# Patient Record
Sex: Female | Born: 1970
Health system: Southern US, Community
[De-identification: ages and names within clinical notes are randomized; demographics above are authoritative.]

## PROBLEM LIST (undated history)

## (undated) ENCOUNTER — Emergency Department (HOSPITAL_COMMUNITY): Admission: EM | Disposition: A | Discharge status: Home / Self Care | Payer: 59

## (undated) DIAGNOSIS — Z87898 Personal history of other specified conditions: Secondary | ICD-10-CM

## (undated) DIAGNOSIS — R102 Pelvic and perineal pain unspecified side: Secondary | ICD-10-CM

## (undated) DIAGNOSIS — Z87442 Personal history of urinary calculi: Secondary | ICD-10-CM

## (undated) DIAGNOSIS — N281 Cyst of kidney, acquired: Secondary | ICD-10-CM

## (undated) DIAGNOSIS — G8929 Other chronic pain: Secondary | ICD-10-CM

## (undated) DIAGNOSIS — T7840XA Allergy, unspecified, initial encounter: Secondary | ICD-10-CM

## (undated) DIAGNOSIS — D649 Anemia, unspecified: Secondary | ICD-10-CM

## (undated) DIAGNOSIS — K219 Gastro-esophageal reflux disease without esophagitis: Secondary | ICD-10-CM

## (undated) DIAGNOSIS — H04129 Dry eye syndrome of unspecified lacrimal gland: Secondary | ICD-10-CM

## (undated) HISTORY — DX: Personal history of urinary calculi: Z87.442

## (undated) HISTORY — DX: Gastro-esophageal reflux disease without esophagitis: K21.9

## (undated) HISTORY — DX: Personal history of other specified conditions: Z87.898

## (undated) HISTORY — PX: UPPER GASTROINTESTINAL ENDOSCOPY: SHX188

## (undated) HISTORY — DX: Allergy, unspecified, initial encounter: T78.40XA

## (undated) HISTORY — PX: TONSILLECTOMY: SHX5217

## (undated) HISTORY — DX: Pelvic and perineal pain unspecified side: R10.20

## (undated) HISTORY — DX: Anemia, unspecified: D64.9

## (undated) HISTORY — DX: Pelvic and perineal pain: R10.2

## (undated) HISTORY — DX: Dry eye syndrome of unspecified lacrimal gland: H04.129

## (undated) HISTORY — PX: APPENDECTOMY: SHX54

## (undated) HISTORY — PX: VAGINAL HYSTERECTOMY: SHX2639

## (undated) HISTORY — DX: Other chronic pain: G89.29

---

## 1998-10-23 ENCOUNTER — Other Ambulatory Visit: Admission: RE | Admit: 1998-10-23 | Discharge: 1998-10-23 | Payer: Self-pay | Admitting: Orthopedic Surgery

## 2000-02-17 ENCOUNTER — Encounter
Admission: RE | Admit: 2000-02-17 | Discharge: 2000-02-17 | Payer: Self-pay | Admitting: Physical Medicine & Rehabilitation

## 2000-02-17 ENCOUNTER — Encounter: Payer: Self-pay | Admitting: Physical Medicine & Rehabilitation

## 2000-02-26 ENCOUNTER — Encounter
Admission: RE | Admit: 2000-02-26 | Discharge: 2000-02-26 | Payer: Self-pay | Admitting: Physical Medicine & Rehabilitation

## 2000-02-26 ENCOUNTER — Encounter: Payer: Self-pay | Admitting: Physical Medicine & Rehabilitation

## 2000-02-27 ENCOUNTER — Other Ambulatory Visit: Admission: RE | Admit: 2000-02-27 | Discharge: 2000-02-27 | Payer: Self-pay | Admitting: Obstetrics & Gynecology

## 2000-03-19 ENCOUNTER — Ambulatory Visit (HOSPITAL_COMMUNITY): Admission: RE | Admit: 2000-03-19 | Discharge: 2000-03-19 | Payer: Self-pay | Admitting: Obstetrics & Gynecology

## 2001-05-23 ENCOUNTER — Other Ambulatory Visit: Admission: RE | Admit: 2001-05-23 | Discharge: 2001-05-23 | Payer: Self-pay | Admitting: Family Medicine

## 2001-12-28 HISTORY — PX: COLONOSCOPY: SHX174

## 2002-02-06 ENCOUNTER — Emergency Department (HOSPITAL_COMMUNITY): Admission: EM | Admit: 2002-02-06 | Discharge: 2002-02-06 | Payer: Self-pay | Admitting: Emergency Medicine

## 2002-08-29 ENCOUNTER — Encounter (INDEPENDENT_AMBULATORY_CARE_PROVIDER_SITE_OTHER): Payer: Self-pay | Admitting: Specialist

## 2002-08-29 ENCOUNTER — Ambulatory Visit (HOSPITAL_COMMUNITY): Admission: RE | Admit: 2002-08-29 | Discharge: 2002-08-29 | Payer: Self-pay | Admitting: Gastroenterology

## 2002-09-07 ENCOUNTER — Encounter: Payer: Self-pay | Admitting: Gastroenterology

## 2002-09-07 ENCOUNTER — Encounter: Admission: RE | Admit: 2002-09-07 | Discharge: 2002-09-07 | Payer: Self-pay | Admitting: Gastroenterology

## 2002-11-15 ENCOUNTER — Ambulatory Visit (HOSPITAL_COMMUNITY): Admission: RE | Admit: 2002-11-15 | Discharge: 2002-11-15 | Payer: Self-pay | Admitting: General Surgery

## 2002-11-15 ENCOUNTER — Encounter (INDEPENDENT_AMBULATORY_CARE_PROVIDER_SITE_OTHER): Payer: Self-pay | Admitting: Specialist

## 2003-03-07 ENCOUNTER — Other Ambulatory Visit: Admission: RE | Admit: 2003-03-07 | Discharge: 2003-03-07 | Payer: Self-pay | Admitting: Obstetrics and Gynecology

## 2003-05-24 ENCOUNTER — Encounter (INDEPENDENT_AMBULATORY_CARE_PROVIDER_SITE_OTHER): Payer: Self-pay

## 2003-05-24 ENCOUNTER — Ambulatory Visit (HOSPITAL_COMMUNITY): Admission: RE | Admit: 2003-05-24 | Discharge: 2003-05-24 | Payer: Self-pay | Admitting: Obstetrics and Gynecology

## 2003-10-16 ENCOUNTER — Encounter: Payer: Self-pay | Admitting: Obstetrics and Gynecology

## 2003-10-16 ENCOUNTER — Inpatient Hospital Stay (HOSPITAL_COMMUNITY): Admission: AD | Admit: 2003-10-16 | Discharge: 2003-10-16 | Payer: Self-pay | Admitting: Obstetrics and Gynecology

## 2003-11-12 ENCOUNTER — Other Ambulatory Visit: Admission: RE | Admit: 2003-11-12 | Discharge: 2003-11-12 | Payer: Self-pay | Admitting: Obstetrics and Gynecology

## 2004-04-01 ENCOUNTER — Inpatient Hospital Stay (HOSPITAL_COMMUNITY): Admission: AD | Admit: 2004-04-01 | Discharge: 2004-04-02 | Payer: Self-pay | Admitting: Pediatrics

## 2004-05-18 ENCOUNTER — Inpatient Hospital Stay (HOSPITAL_COMMUNITY): Admission: AD | Admit: 2004-05-18 | Discharge: 2004-05-20 | Payer: Self-pay | Admitting: Obstetrics and Gynecology

## 2005-01-07 ENCOUNTER — Ambulatory Visit (HOSPITAL_COMMUNITY)
Admission: RE | Admit: 2005-01-07 | Discharge: 2005-01-07 | Payer: Self-pay | Admitting: Physical Medicine & Rehabilitation

## 2005-02-05 ENCOUNTER — Other Ambulatory Visit: Admission: RE | Admit: 2005-02-05 | Discharge: 2005-02-05 | Payer: Self-pay | Admitting: Obstetrics and Gynecology

## 2005-09-03 ENCOUNTER — Other Ambulatory Visit: Admission: RE | Admit: 2005-09-03 | Discharge: 2005-09-03 | Payer: Self-pay | Admitting: Obstetrics and Gynecology

## 2005-10-20 ENCOUNTER — Observation Stay (HOSPITAL_COMMUNITY): Admission: RE | Admit: 2005-10-20 | Discharge: 2005-10-21 | Payer: Self-pay | Admitting: Physical Therapy

## 2005-10-20 ENCOUNTER — Encounter (INDEPENDENT_AMBULATORY_CARE_PROVIDER_SITE_OTHER): Payer: Self-pay | Admitting: *Deleted

## 2009-03-19 LAB — CONVERTED CEMR LAB

## 2009-03-22 ENCOUNTER — Emergency Department (HOSPITAL_COMMUNITY): Admission: EM | Admit: 2009-03-22 | Discharge: 2009-03-22 | Payer: Self-pay | Admitting: Family Medicine

## 2009-03-26 ENCOUNTER — Ambulatory Visit: Payer: Self-pay | Admitting: Internal Medicine

## 2009-03-26 DIAGNOSIS — F17200 Nicotine dependence, unspecified, uncomplicated: Secondary | ICD-10-CM

## 2009-03-26 DIAGNOSIS — R599 Enlarged lymph nodes, unspecified: Secondary | ICD-10-CM | POA: Insufficient documentation

## 2009-03-26 DIAGNOSIS — Z87442 Personal history of urinary calculi: Secondary | ICD-10-CM | POA: Insufficient documentation

## 2009-03-26 DIAGNOSIS — R635 Abnormal weight gain: Secondary | ICD-10-CM

## 2009-03-26 DIAGNOSIS — R609 Edema, unspecified: Secondary | ICD-10-CM

## 2009-03-27 ENCOUNTER — Ambulatory Visit: Payer: Self-pay | Admitting: Internal Medicine

## 2009-03-27 LAB — CONVERTED CEMR LAB
ALT: 18 units/L (ref 0–35)
AST: 18 units/L (ref 0–37)
Albumin: 4 g/dL (ref 3.5–5.2)
BUN: 15 mg/dL (ref 6–23)
Basophils Absolute: 0 10*3/uL (ref 0.0–0.1)
Bilirubin Urine: NEGATIVE
CO2: 30 meq/L (ref 19–32)
Chloride: 106 meq/L (ref 96–112)
Cholesterol: 151 mg/dL (ref 0–200)
Eosinophils Absolute: 0.1 10*3/uL (ref 0.0–0.7)
Free T4: 0.8 ng/dL (ref 0.6–1.6)
Glucose, Bld: 86 mg/dL (ref 70–99)
Hemoglobin: 14.4 g/dL (ref 12.0–15.0)
Ketones, ur: NEGATIVE mg/dL
Leukocytes, UA: NEGATIVE
Lymphocytes Relative: 31.4 % (ref 12.0–46.0)
MCHC: 35 g/dL (ref 30.0–36.0)
MCV: 93.5 fL (ref 78.0–100.0)
Monocytes Absolute: 0.3 10*3/uL (ref 0.1–1.0)
Neutro Abs: 3 10*3/uL (ref 1.4–7.7)
Potassium: 4.5 meq/L (ref 3.5–5.1)
RDW: 11.8 % (ref 11.5–14.6)
Triglycerides: 62 mg/dL (ref 0.0–149.0)
Urobilinogen, UA: 0.2 (ref 0.0–1.0)

## 2009-05-16 ENCOUNTER — Emergency Department (HOSPITAL_COMMUNITY): Admission: EM | Admit: 2009-05-16 | Discharge: 2009-05-16 | Payer: Self-pay | Admitting: Family Medicine

## 2009-05-31 ENCOUNTER — Telehealth: Payer: Self-pay | Admitting: Internal Medicine

## 2009-05-31 ENCOUNTER — Ambulatory Visit: Payer: Self-pay | Admitting: Diagnostic Radiology

## 2009-05-31 ENCOUNTER — Ambulatory Visit: Payer: Self-pay | Admitting: Internal Medicine

## 2009-05-31 ENCOUNTER — Ambulatory Visit (HOSPITAL_BASED_OUTPATIENT_CLINIC_OR_DEPARTMENT_OTHER): Admission: RE | Admit: 2009-05-31 | Discharge: 2009-05-31 | Payer: Self-pay | Admitting: Internal Medicine

## 2009-05-31 DIAGNOSIS — R109 Unspecified abdominal pain: Secondary | ICD-10-CM

## 2009-09-26 ENCOUNTER — Ambulatory Visit (HOSPITAL_BASED_OUTPATIENT_CLINIC_OR_DEPARTMENT_OTHER): Admission: RE | Admit: 2009-09-26 | Discharge: 2009-09-26 | Payer: Self-pay | Admitting: Internal Medicine

## 2009-09-26 ENCOUNTER — Ambulatory Visit: Payer: Self-pay | Admitting: Internal Medicine

## 2009-09-26 ENCOUNTER — Ambulatory Visit: Payer: Self-pay | Admitting: Diagnostic Radiology

## 2009-09-26 ENCOUNTER — Telehealth: Payer: Self-pay | Admitting: Internal Medicine

## 2009-09-26 DIAGNOSIS — M533 Sacrococcygeal disorders, not elsewhere classified: Secondary | ICD-10-CM

## 2009-10-28 ENCOUNTER — Telehealth: Payer: Self-pay | Admitting: Internal Medicine

## 2009-11-04 ENCOUNTER — Ambulatory Visit: Payer: Self-pay | Admitting: Internal Medicine

## 2009-11-05 ENCOUNTER — Telehealth: Payer: Self-pay | Admitting: Internal Medicine

## 2009-11-11 ENCOUNTER — Encounter: Payer: Self-pay | Admitting: Internal Medicine

## 2010-07-21 ENCOUNTER — Ambulatory Visit: Payer: Self-pay | Admitting: Internal Medicine

## 2010-07-22 ENCOUNTER — Emergency Department (HOSPITAL_COMMUNITY): Admission: EM | Admit: 2010-07-22 | Discharge: 2010-07-23 | Payer: Self-pay | Admitting: Emergency Medicine

## 2011-01-27 NOTE — Assessment & Plan Note (Signed)
Summary: CONGESTION x10 DAYS COUGH/MHF   Vital Signs:  Patient profile:   40 year old female Height:      62 inches Weight:      143.75 pounds BMI:     26.39 O2 Sat:      99 % on Room air Temp:     98.0 degrees F oral Pulse rate:   69 / minute Pulse rhythm:   regular Resp:     18 per minute BP sitting:   90 / 60  (left arm) Cuff size:   large  Vitals Entered By: Glendell Docker CMA (July 21, 2010 2:05 PM)  O2 Flow:  Room air CC: Rm 3- Chest Congestion Is Patient Diabetic? No Pain Assessment Patient in pain? no        Primary Care Provider:  DThomos Lemons DO  CC:  Rm 3- Chest Congestion.  History of Present Illness: 40 y/o female  c/o nasal congestion, drainage clear to yellow, productive cough yellow in color x 5 days,  nausea &vomiting , no temp at present, taken rx cough syrup from [facility] .nasal congestion better but persistent cough  Preventive Screening-Counseling & Management  Alcohol-Tobacco     Smoking Status: quit  Allergies (verified): No Known Drug Allergies  Past History:  Past Medical History: Nephrolithiasis, hx of Hx of fainting spells during pregnancy  Hx of chronic pelvic pain - improved after hysterectomy         Past Surgical History: Appendectomy  Tonsillectomy Hysterectomy  3 vaginal births Colonoscopy 2003 - Dr. Loreta Ave - Normal)     Family History: Family hx of uterine cancer PGF - DM II, CAD, hyperlipidemia    breast cancer - no   Social History: Occupation: Engineer, water for [employer] Married- 6 years Children - teenagers    Smoking Status:  quit  Physical Exam  General:  alert, well-developed, and well-nourished.   Ears:  R ear normal and L ear normal.   Mouth:  pharynx pink and moist.  post nasal gtt Lungs:  normal respiratory effort, normal breath sounds, and no wheezes.   Heart:  normal rate, regular rhythm, and no gallop.     Impression & Recommendations:  Problem # 1:  BRONCHITIS-ACUTE  (ICD-466.0)  Her updated medication list for this problem includes:    Azithromycin 250 Mg Tabs (Azithromycin) .Marland Kitchen... 2 tabs on day one, then one by mouth once daily x 4 days  Take antibiotics and other medications as directed. Encouraged to push clear liquids, get enough rest, and take acetaminophen as needed. To be seen in 5-7 days if no improvement, sooner if worse.  Complete Medication List: 1)  Azithromycin 250 Mg Tabs (Azithromycin) .... 2 tabs on day one, then one by mouth once daily x 4 days 2)  Prednisone 20 Mg Tabs (Prednisone) .... One by mouth two times a day x 4 days, then 1/2 by mouth two times a day x 4 days, then 1/2 by mouth once daily x 4 days  Patient Instructions: 1)  Call our office if your symptoms do not  improve or gets worse. Prescriptions: PREDNISONE 20 MG TABS (PREDNISONE) one by mouth two times a day x 4 days, then 1/2 by mouth two times a day x 4 days, then 1/2 by mouth once daily x 4 days  #14 x 0   Entered and Authorized by:   D. Thomos Lemons DO   Signed by:   D. Thomos Lemons DO on 07/21/2010   Method  used:   Electronically to        [facility]* (retail)       [address]       [address] Shipping/mailing       [place], [place]  [address]       Ph: [phone]       Fax: [phone]   RxID:   [id number] AZITHROMYCIN 250 MG TABS (AZITHROMYCIN) 2 tabs on day one, then one by mouth once daily x 4 days  #6 x 0   Entered and Authorized by:   D. Thomos Lemons DO   Signed by:   D. Thomos Lemons DO on 07/21/2010   Method used:   Electronically to        [facility]* (retail)       [address]       [address] Shipping/mailing       [place], [place]  [address]       Ph: [phone]       Fax: [phone]   RxID:   [id number]     Current Allergies (reviewed today): No known allergies

## 2011-03-14 LAB — POCT I-STAT, CHEM 8
BUN: 4 mg/dL — ABNORMAL LOW (ref 6–23)
Calcium, Ion: 1.19 mmol/L (ref 1.12–1.32)
Creatinine, Ser: 0.8 mg/dL (ref 0.4–1.2)
Glucose, Bld: 112 mg/dL — ABNORMAL HIGH (ref 70–99)
Hemoglobin: 14.6 g/dL (ref 12.0–15.0)
Sodium: 141 mEq/L (ref 135–145)
TCO2: 28 mmol/L (ref 0–100)

## 2011-03-14 LAB — URINALYSIS, ROUTINE W REFLEX MICROSCOPIC
Bilirubin Urine: NEGATIVE
Hgb urine dipstick: NEGATIVE
Ketones, ur: NEGATIVE mg/dL
Protein, ur: NEGATIVE mg/dL
Urobilinogen, UA: 0.2 mg/dL (ref 0.0–1.0)

## 2011-05-15 NOTE — Op Note (Signed)
[name], [name]                          ACCOUNT NO.:  [id number]   MEDICAL RECORD NO.:  [id number]                   PATIENT TYPE:  AMB   LOCATION:  SDC                                  FACILITY:  [facility]   PHYSICIAN:  Dineen Kid. Rana Snare, M.D.                 DATE OF BIRTH:  08/01/1971   DATE OF PROCEDURE:  05/24/2003  DATE OF DISCHARGE:                                 OPERATIVE REPORT   PREOPERATIVE DIAGNOSES:  1. Dysmenorrhea.  2. Pelvic pain.  3. Dyspareunia.  4. Abnormal uterine bleeding.  5. Endometrial mass consistent with a polyp.   POSTOPERATIVE DIAGNOSES:  1. Dysmenorrhea.  2. Pelvic pain.  3. Dyspareunia.  4. Abnormal uterine bleeding.  5. Endometrial mass consistent with a polyp.   PROCEDURE:  Hysteroscopy with D&C with polypectomy and diagnostic  laparoscopy.   SURGEON:  Dineen Kid. Rana Snare, M.D.   ANESTHESIA:  General endotracheal anesthesia.   INDICATIONS FOR PROCEDURE:  The patient is a 40 year old G2, P2 with  abnormal uterine bleeding not responding to birth control pills.  She  underwent a sonohistogram which shows endometrial mass consistent with a  small polyp and a right ovarian cyst.  The patient has also been having  worsening pelvic pain over the last number of years, worse in the last  several months.  She was previously told she may have endometriosis.  She  has had pain with intercourse and pain with adherence, again no responsive  to conservative medical management.  She desire more definitive surgical  evaluation of this.  Risks and benefits were discussed.  Informed consent  was obtained.   FINDINGS:  Small endometrial polyp, posterior endometrium.  After D&C,  normal-appearing endometrial cavity, normal-appearing ostium.  Laparoscope  reveals a bulky appearing uterus, otherwise a normal cul-de-sac, ovaries,  tubes, bladder; there is a small staple in the cul-de-sac from previous  appendectomy which was removed and a normal-appearing gallbladder  and liver.   DESCRIPTION OF PROCEDURE:  After adequate analgesia, the patient was placed  in the dorsal lithotomy position.  She was sterilely prepped and draped.  Bladder was sterilely drained.  Graves speculum was placed.  Tenaculum was  placed in the anterior lip of the cervix.  The uterus was sounded to  approximately 9 cm, easily dilated to a #27 Pratt dilator.  Hysteroscope was  inserted and the above findings were noted.  Curettage was performed  retrieving small fragments of endometrial polyps.  This was performed until  the gritty surface was felt throughout the endometrial cavity followed by  hysteroscope which revealed a normal-appearing endometrial cavity and no  residual polyps at this time. At this point, the tenaculum was changed out  to a Hulka tenaculum and hysteroscope was removed as was the Graves  speculum.  The 1 cm skin incision was made.  A Veress needle was inserted.  The abdomen was insufflated, dullness  to percussion and an 11 mm trocar was  inserted and laparoscope was then inserted with the above findings noted.  A  5 mm trocar was inserted to the left of midline two fingerbreadths above the  pubic symphysis and a 5 mm trocar was inserted under direct visualization.  After careful examination of the abdomen, pelvis and cul-de-sac, small  staple from the previous appendectomy was easily removed.  The only abnormal  finding at this point was slightly  bulky uterus.  Trocars were removed and  noted to be hemostatic.  The infraumbilical skin incision was closed with 0  Vicryl in the fascia, the 5 mm site interrupted suture of 0 Vicryl was  placed in the fascia as well.  3-0 Vicryl Rapide was used as a subcuticular  stitch to close the incisions with good approximation and good hemostasis.  The incisions were then injected with 0.25% Marcaine.  The tenaculum was  removed from the cervix and noted to be hemostatic.  The patient was stable  on transfer to the recovery  room.  Sponge and instrument count was correct  x3.  The estimated blood loss was less than 10 mL with a notable Sorbitol  deficit.   DISPOSITION:  The patient is discharged home and will follow up in the  office in two to three weeks.  She is sent home with a routine instruction  sheet for laparoscopy and D&C and a prescription for Darvocet #30.                                               Dineen Kid Rana Snare, M.D.    DCL/MEDQ  D:  05/24/2003  T:  05/24/2003  Job:  [id number]

## 2011-05-15 NOTE — Discharge Summary (Signed)
NAMEKINDLE, STROHMEIER                ACCOUNT NO.:  [id number]   MEDICAL RECORD NO.:  [id number]          PATIENT TYPE:  OBV   LOCATION:  9312                          FACILITY:  [facility]   PHYSICIAN:  Dineen Kid. Rana Snare, M.D.    DATE OF BIRTH:  03-13-1971   DATE OF ADMISSION:  10/20/2005  DATE OF DISCHARGE:  10/21/2005                                 DISCHARGE SUMMARY   HISTORY OF PRESENT ILLNESS:  Ms. [name] is a 40 year old G3, P3, with  menometrorrhagia, dyspareunia, pelvic pain, did have right lower quadrant  pain with radiation through her back, worse when she is sitting for  prolonged periods of time.  She is unable to tolerate hormones due to  migraines.  Ultrasound and sonohysterogram shows a retroverted uterus, no  intracavitary masses.  She also continues to have dyspareunia which affects  her ability to have normal relations with her husband.  She also bleeds  eight to 10 days a month with her cycle, and her cycle is every 21 days.  She is desirous to have a surgical intervention and presents for  laparoscopically-assisted vaginal hysterectomy.   HOSPITAL COURSE:  The patient underwent LAVH.  Surgery was uncomplicated.  Her blood loss was 700 mL, however.  Her postoperative care was  unremarkable.  By postop day #1 she was tolerating a regular diet,  ambulating without difficulty, able to pass flatus.  Her hemoglobin  postoperatively was 9.1.  The incision was clean, dry, and intact.  She had  normoactive bowel sounds, and the patient was discharged home.   DISPOSITION:  The patient will be discharged home and will follow up in the  office in one to two weeks.  Told to return for any increased pain, fever or  bleeding.  She was sent home with a prescription for Darvocet #30 and  ibuprofen and iron.      Dineen Kid Rana Snare, M.D.  Electronically Signed     DCL/MEDQ  D:  10/21/2005  T:  10/21/2005  Job:  [id number]

## 2011-05-15 NOTE — Op Note (Signed)
[name], [name]                            ACCOUNT NO.:  [id number]   MEDICAL RECORD NO.:  [id number]                   PATIENT TYPE:  AMB   LOCATION:  DAY                                  FACILITY:  [facility]   PHYSICIAN:  Ollen Gross. Vernell Morgans, M.D.              DATE OF BIRTH:  January 03, 1971   DATE OF PROCEDURE:  11/15/2002  DATE OF DISCHARGE:                                 OPERATIVE REPORT   PREOPERATIVE DIAGNOSIS:  Chronic right lower quadrant pain.   POSTOPERATIVE DIAGNOSIS:  Chronic right lower quadrant pain.   PROCEDURE:  Diagnostic laparoscopy and laparoscopic appendectomy.   SURGEON:  Ollen Gross. Carolynne Edouard, M.D.   ANESTHESIA:  General endotracheal.   DESCRIPTION OF PROCEDURE:  After informed consent was obtained, the patient  was brought to the operating room and placed in a supine position on the  operating room table.  After adequate induction of general anesthesia, the  patient's legs were placed in Yellowfin skis, and then the patient's abdomen  and perineum was prepped with Betadine and draped in the usual sterile  manner.  The area below the umbilicus was infiltrated with 0.25% Marcaine,  and the small incisions were made with the 15 blade knife.  This incision  was carried down through the subcutaneous tissue bluntly using Kelly clamps  and Army-Navy retractors until the linea alba was identified.  The linea  alba was incised with the 15 blade knife, and each side was grasped with  Kocher clamps and elevated anteriorly.  The preperitoneal space was then  probed bluntly with a hemostat until the peritoneum was opened and access  was gained to the abdominal cavity.  A 0 Vicryl pursestring stitch was  placed in the fascia surrounding this opening.  Next, a Hasson cannula was  placed through this opening and anchored in place with the previously-placed  Vicryl pursestring stitch.  The abdomen was then insufflated with carbon  dioxide.  The patient was placed in a Trendelenburg  position.  The  laparoscope was placed through the Hasson cannula, and the abdomen was  inspected.  The upper abdomen appeared normal.  The gallbladder had a normal  appearance as well as the liver.  There were no obvious adhesions down in  the pelvis.  Next, a small area in the left lower quadrant was infiltrated  with 0.25% Marcaine.  A small stab incision was made with the 15 blade  knife, and a 5 mm port was placed bluntly through this incision into the  abdominal cavity under direct vision in the right lower quadrant,.  Also, an  area was infiltrated with 0.25% Marcaine.  A small incision was made with  the 15 blade knife, and then a 12 mm port was placed bluntly through this  incision into the abdominal cavity under direct vision.  The abdomen was  explored.  The uterus appeared normal.  The ovaries were  identified on both  sides.  The left ovary had some small cysts.  The right ovary was about  twice the size of the left ovary and appeared to have one large cyst in it.  These findings were discussed with her GYN doctor, Malva Limes, M.D., over  the phone, who elected to observe these areas and not do anything about them  intraoperatively.  The right colon did appear to be somewhat floppy and did  extend across the midline overlying the uterus, but there was no  inflammation nor any kind of acute changes to the right colon.  It appeared  to be normal.  The appendix was identified very easily and appeared to be a  normal appendix without any inflammatory changes.  Because of the location  of the appendix and because of her desire to have it removed, the  mesoappendix was taken down with a harmonic scalpel.  An endoscopic GIA  stapler was then placed across the base of the appendix, and the stapler was  fired.  The appendix was divided without difficulty, and the staple line  appeared intact and healthy.  The appendix was then placed within an  endoscopic bag, and the bag was sealed.   Prior to removing the appendix,  pictures were taken of the pelvis to supply to the patient and for her  chart.  The laparoscope was then moved to the 12 mm port, and a grasper was  placed through the Hasson cannula and used to grasp the endoscopic bag.  The  bag with the appendix was then removed with the Hasson cannula.  The fascial  defect was then closed with the previously-placed Vicryl pursestring stitch.  The rest of the ports were removed under direct vision and were hemostatic.  The gas was allowed to escape.  The fascia of the right lower quadrant  incision was also closed with a figure-of-eight 0 Vicryl stitch.  The skin  incisions were all closed with interrupted 4-0 Monocryl subcuticular  stitches.  Benzoin and Steri-Strips were applied.  The patient tolerated the  procedure well.  At the end of the case, all needle, sponge, and instrument  counts were correct.  The patient was awakened and taken to the recovery  room in stable condition.                                               Ollen Gross. Vernell Morgans, M.D.    PST/MEDQ  D:  11/16/2002  T:  11/16/2002  Job:  [id number]

## 2011-05-15 NOTE — Op Note (Signed)
[name] Rivera, [name] Rivera                ACCOUNT NO.:  [id number]   MEDICAL RECORD NO.:  [id number]          PATIENT TYPE:  OBV   LOCATION:  9399                          FACILITY:  [facility]   PHYSICIAN:  Dineen Kid. Rana Snare, M.D.    DATE OF BIRTH:  03/09/1971   DATE OF PROCEDURE:  10/20/2005  DATE OF DISCHARGE:                                 OPERATIVE REPORT   PREOPERATIVE DIAGNOSIS:  Menometrorrhagia, dyspareunia, and pelvic pain not  responsive to conservative medical management.   POSTOPERATIVE DIAGNOSIS:  Menometrorrhagia, dyspareunia, and pelvic pain not  responsive to conservative medical management.   PROCEDURE:  Laparoscopically-assisted vaginal hysterectomy.   SURGEON:  Dineen Kid. Rana Snare, M.D.   ANESTHESIA:  General endotracheal.   ASSISTANT:  Duke Salvia. Marcelle Overlie, M.D.   INDICATIONS FOR PROCEDURE:  Ms. [name] Rivera is a 40 year old gravida 3, para 3,  with menometrorrhagia, dyspareunia, and pelvic pain, unable to tolerate  hormonal manipulation due to migraines.  She underwent a sonohysterogram  showing a retroverted uterus with no intracavitary masses.  She also  continues to have dyspareunia which has effected her ability to have normal  relations with her husband.  She does bleed 8-10 days per cycle.  Her cycles  are 21 days in length.  She desires definitive surgical intervention and  requests hysterectomy.  She presents for laparoscopically-assisted vaginal  hysterectomy.  Risks and benefits were discussed at length.  Informed  consent was obtained.   FINDINGS:  Boggy uterus approximately 6 weeks size consistent with  adenomyosis.  Normal appearing pelvis.  Normal appearing ovaries.  Appendix  is surgically absent.  Normal appearing liver.   DESCRIPTION OF PROCEDURE:  After adequate analgesia, the patient was placed  in the dorsal lithotomy position.  She was sterilely prepped and draped.  The bladder was sterilely drained.  A Hulka tenaculum was placed on the  anterior lip of the  cervix.  A 1 cm infraumbilical skin incision was made.  The Veress needle was inserted.  The abdomen was insufflated to dullness to  percussion.  An 11 mm trocar was inserted.  The laparoscope was inserted and  the above findings were noted.  A 5 mm trocar was inserted to the left of  the midline two fingerbreadths above the pubic symphysis under direct  visualization.  A Gyrus tripolar was used to coagulate and ligate across the  left broad ligament, down across the left utero-ovarian ligament.  This was  done similarly along the right round ligament and the right utero-ovarian  ligament with good hemostasis achieved.  Ovaries falling lateral to the  uterus.  The bladder was elevated.  A bladder flap was created at the  uterovesical junction using Endoshears.  The abdomen was deflated, the legs  were repositioned.  Weighted speculum placed in the vagina.  Posterior  colpotomy was performed at which time a moderate amount of blood was found  in the cul-de-sac.  The cervix was circumscribed with Bovie cautery.  A  LigaSure instrument was used to ligate across the uterosacral ligaments  bilaterally across the cardinal ligaments bilaterally and the bladder  pillars.  The anterior vaginal mucosa was entered sharply and a Deaver  retractor was placed underneath the bladder.  The uterine vasculature was  identified on bilateral sides, ligated with the LigaSure instrument.  The  inferior portion of the broad ligament was similarly ligated and dissected  with the Mayo scissors.  The uterus was removed.  A bleeding artery along  the right at the uterine artery was identified.  It was clamped with a  Heaney clamp, suture ligated with a 0 Monocryl suture with good hemostasis  achieved.  The uterosacral ligaments were similarly identified, ligated with  a 0 Monocryl suture in a figure-of-eight fashion.  After careful examination  of the pedicles including the ovaries, no obvious bleeding was  encountered  at this time, but a moderate amount of blood had been lost from the right  uterine artery in the process of dissecting this free.  A small __________  was placed.  Posterior peritoneum was closed in a pursestring fashion.  The  vaginal mucosa was closed in a vertical fashion using figure-of-eights of 0  Monocryl suture with good approximation and good hemostasis.  The __________  was then removed.  Anterior vaginal mucosa was closed in a similar fashion  with figure-of-eights of 0 Monocryl suture.  Foley catheter was placed with  return of clear yellow urine.  Legs were repositioned and abdomen  reinsufflated.  The laparoscope was inserted.  A Nezhat suction irrigator  was used and after a copious amount of irrigation, adequate hemostasis  appeared to be achieved.  Small peritoneal bleeders were coagulated with  bipolar cautery after careful examination of all the pedicles.  Good  hemostasis appeared to be achieved.  At this time the abdomen was  desufflated while continuing to look with the laparoscope.  The laparoscope  was then removed.  Trocars were removed.  The infraumbilical skin incision  was closed with a 0 Vicryl interrupted suture on the fascia and 3-0 Vicryl  Rapide subcuticular suture.  The 5 mm site was closed with Dermabond with  good approximation and good hemostasis.  The patient was then transferred to  the recovery room in stable condition.  Needle, sponge, and instrument  counts correct x3.  Estimated blood loss was 700 mL.  The patient received 1  gram of Rocephin preoperatively.      Dineen Kid Rana Snare, M.D.  Electronically Signed     DCL/MEDQ  D:  10/20/2005  T:  10/20/2005  Job:  [id number]

## 2011-05-15 NOTE — Op Note (Signed)
[facility]  Patient:    [name], [name]                          MRN: [id number] Proc. Date: 03/19/00 Adm. Date:  [id number] Disc. Date: [id number] Attending:  Lars Pinks                           Operative Report  PREOPERATIVE DIAGNOSIS:       Persistent chronic right lower quadrant pain.  POSTOPERATIVE DIAGNOSIS:      Persistent chronic right lower quadrant pain.  PROCEDURE:                    Diagnostic laparoscopy.  SURGEON:                      Richard D. Arlyce Dice, M.D.  ANESTHESIA:                   General endotracheal.  ESTIMATED BLOOD LOSS:         5 cc.  FINDINGS:                     Normal-appearing pelvic.  Tubes and ovaries were normal.  There were no adhesions.  There was no endometriosis.  The uterus appeared normal.  The cecum and appendix appeared normal.  The gallbladder and liver edge appeared normal.  INDICATIONS:                  This is a 40 year old female with persistent right lower quadrant pain that has been going on for several months.  The pain is sometimes associated with nausea and diarrhea; at other times, the pain seems to be related to the right lower quadrant, associated with pelvic cramping.  The pain is severe at times and has caused her to change her normal routine function.  DESCRIPTION OF PROCEDURE:     The patient was taken to the operating room and placed in the supine position and general endotracheal anesthesia was induced. She was then placed in the dorsal lithotomy position and the abdomen, perineum and vagina were prepped and draped in sterile fashion.  An incision was made at the  base of the umbilicus and a Veress needle was introduced into the peritoneal cavity and pneumoperitoneum was created.  The laparoscopic trocar was introduced into he pneumoperitoneum and under direct visualization, an accessory instrument was placed through a suprapubic stab wound.  Using an accessory probe,  the pelvis was viewed, with the findings noted above.  After the absence of pathology was established, the procedure was then terminated.  The gas was allowed to escape.  The umbilical incision was closed with a subcuticular 4-0 Dexon suture and the accessory port was closed with Steri-Strips.  The patient tolerated the procedure well and left the operating room in good condition. DD:  03/19/00 TD:  03/22/00 Job: [id number]

## 2011-05-15 NOTE — H&P (Signed)
NAMEMYKA, HITZ                ACCOUNT NO.:  [id number]   MEDICAL RECORD NO.:  [id number]          PATIENT TYPE:  AMB   LOCATION:  SDC                           FACILITY:  [facility]   PHYSICIAN:  Dineen Kid. Rana Snare, M.D.    DATE OF BIRTH:  22-Jun-1971   DATE OF ADMISSION:  DATE OF DISCHARGE:                                HISTORY & PHYSICAL   HISTORY OF PRESENT ILLNESS:  [name] is a 40 year old, gravida 3, para  3, with menometrorrhagia, dyspareunia, and pelvic pain.  She has continued  to have right lower quadrant pain which radiates through to her lower back  and tail bone, worse when she is sitting for prolonged periods of time.  She  is unable to tolerate hormones.  She underwent ultrasound and sonohystogram  evaluation which shows a retroverted uterus.  No intracavitary masses and  otherwise normal-appearing uterus.  She also continues to have dyspareunia  which has affected her ability to have normal relations with her husband.  She does bleed eight to 10 days when she does have a cycle.  Her cycles are  every 21 days.  At this time she desires definitive surgical intervention  and requests hysterectomy and presents for laparoscopically-assisted  hysterectomy today.  She has no further child-bearing desires.   PAST MEDICAL HISTORY:  1.  Significant for endometrial polyp.  2.  History of kidney stones.   PAST SURGICAL HISTORY:  1.  She has had an appendectomy.  2..  She has had hysteroscopy and dilatation and curettage.  1.  She has had a tonsillectomy.  2.  She has had three vaginal births.   MEDICATIONS:  Currently no medications.   ALLERGIES:  She has no known drug allergies.   PHYSICAL EXAMINATION:  VITAL SIGNS:  Blood pressure 108/62, weight 146.  HEART:  Regular rate and rhythm.  LUNGS:  Clear to auscultation bilaterally.  ABDOMEN:  Nondistended, nontender.  PELVIC:  Uterus is retroverted, mobile, minimal tenderness.  No uterosacral  nodularity noted.   IMPRESSION  AND PLAN:  1.  Menometrorrhagia.  2.  Dyspareunia.  3.  Pelvic pain not responsive to conservative medical management.   Discussed different options with her at length which would include  continuing anti-inflammatory medications.  She is unable to take oral  contraceptive agents due to migraine headaches.  Considered endometrial  ablation, laparoscopic evaluation and pelvic pain, or hysterectomy.  At this  time she desires hysterectomy.   PLAN:  Laparoscopically-assisted vaginal hysterectomy.  Discussed the risks  and benefits of the procedure at length which include but not limited to  risk of infection, bleeding, damage to the bowel, bladder, ureters, ovaries,  risk associated with general anesthesia, risk associated with blood  transfusion.  Also discussed the possibility that this may not alleviate the  pelvic pain.  It could worsen or it could recur.  All of her questions were  answered. She was given written and informed consent.      Dineen Kid Rana Snare, M.D.  Electronically Signed     DCL/MEDQ  D:  10/19/2005  T:  10/19/2005  Job:  [id number]

## 2011-05-15 NOTE — Op Note (Signed)
   NAMEKILANI, [name]                            ACCOUNT NO.:  [id number]   MEDICAL RECORD NO.:  [id number]                   PATIENT TYPE:  AMB   LOCATION:  ENDO                                 FACILITY:  [facility]   PHYSICIAN:  Charna Elizabeth, M.D.                   DATE OF BIRTH:  20-Jul-1971   DATE OF PROCEDURE:  08/29/2002  DATE OF DISCHARGE:                                 OPERATIVE REPORT   PROCEDURE PERFORMED:  Colonoscopy with biopsy.   ENDOSCOPIST:  Charna Elizabeth, M.D.   INSTRUMENT USED:  Olympus video colonoscope (adjustable pediatric scope)   INDICATIONS FOR PROCEDURE:  This 40 year old white female with a history of  rectal bleeding and rectal fullness on digital examination, diarrhea, and  abdominal pain, rule out IBD.   PREPROCEDURE PREPARATION:  Informed consent was procured from the patient.  The patient was fasted for eight hours prior to the procedure and prepped  with a bottle of magnesium citrate and a gallon of NuLytely the night prior  to the procedure.   PREPROCEDURE PHYSICAL:  VITAL SIGNS:  The patient had stable vital signs.  NECK:  Supple.  CHEST:  Clear to auscultation.  S1 and S2 regular.  ABDOMEN:  Soft with normal bowel sounds.   DESCRIPTION OF PROCEDURE:  The patient was placed in the left lateral  decubitus position, sedated with 66 mg of Demerol and 6 mg of Versed  intravenously.  Once the patient was adequately sedated and maintained on  low-flow oxygen and continuous cardiac monitoring, the Olympus video  colonoscope was used to advance into the rectum to the cecum and terminal  ileum without difficulty.  The patient had a prominent-appearing cecal base,  which was biopsied to rule out appendiceal carcinoid.  The rest of the exam  was normal.  The patient had no masses or polyps.  The patient had small,  nonbleeding internal hemorrhoids and tolerated the procedure without  complications.   IMPRESSION:  1. Fullness in the cecal base along the  appendiceal orifice, biopsied to     rule out carcinoid.  2. Otherwise normal-appearing colon and terminal ileum.  3. Small nonbleeding internal hemorrhoids.    RECOMMENDATIONS:  1. Await pathology results.  2. Outpatient followup in the next two weeks for further recommendations.                                               Charna Elizabeth, M.D.    JM/MEDQ  D:  08/30/2002  T:  08/30/2002  Job:  [id number]   cc:   Vella Kohler, MD  [address]

## 2011-07-21 ENCOUNTER — Inpatient Hospital Stay (INDEPENDENT_AMBULATORY_CARE_PROVIDER_SITE_OTHER)
Admission: RE | Admit: 2011-07-21 | Discharge: 2011-07-21 | Disposition: A | Discharge status: Home / Self Care | Payer: 59 | Source: Ambulatory Visit | Attending: Emergency Medicine | Admitting: Emergency Medicine

## 2011-07-21 DIAGNOSIS — J019 Acute sinusitis, unspecified: Secondary | ICD-10-CM

## 2012-04-25 ENCOUNTER — Encounter: Payer: Self-pay | Admitting: Internal Medicine

## 2012-04-26 ENCOUNTER — Ambulatory Visit (INDEPENDENT_AMBULATORY_CARE_PROVIDER_SITE_OTHER): Payer: 59 | Admitting: Internal Medicine

## 2012-04-26 ENCOUNTER — Encounter: Payer: Self-pay | Admitting: Internal Medicine

## 2012-04-26 DIAGNOSIS — R109 Unspecified abdominal pain: Secondary | ICD-10-CM

## 2012-04-26 LAB — HEPATIC FUNCTION PANEL
AST: 15 U/L (ref 0–37)
Albumin: 4.6 g/dL (ref 3.5–5.2)
Alkaline Phosphatase: 63 U/L (ref 39–117)
Total Bilirubin: 0.4 mg/dL (ref 0.3–1.2)
Total Protein: 6.8 g/dL (ref 6.0–8.3)

## 2012-04-26 LAB — BASIC METABOLIC PANEL
BUN: 6 mg/dL (ref 6–23)
CO2: 30 mEq/L (ref 19–32)
Calcium: 9.7 mg/dL (ref 8.4–10.5)
Glucose, Bld: 83 mg/dL (ref 70–99)
Sodium: 144 mEq/L (ref 135–145)

## 2012-04-26 LAB — AMYLASE: Amylase: 48 U/L (ref 0–105)

## 2012-04-26 MED ORDER — ESOMEPRAZOLE MAGNESIUM 40 MG PO CPDR: 40.0000 mg | DELAYED_RELEASE_CAPSULE | Freq: Every day | ORAL | Status: DC

## 2012-04-26 NOTE — Patient Instructions (Signed)
Please schedule your abdominal ultrasound for Monday morning before eating.

## 2012-04-27 LAB — CBC WITH DIFFERENTIAL/PLATELET
Basophils Relative: 0 % (ref 0–1)
Eosinophils Absolute: 0.1 10*3/uL (ref 0.0–0.7)
HCT: 41.8 % (ref 36.0–46.0)
Hemoglobin: 13.9 g/dL (ref 12.0–15.0)
Lymphs Abs: 2.4 10*3/uL (ref 0.7–4.0)
MCH: 31.7 pg (ref 26.0–34.0)
MCHC: 33.3 g/dL (ref 30.0–36.0)
MCV: 95.2 fL (ref 78.0–100.0)
Monocytes Absolute: 0.4 10*3/uL (ref 0.1–1.0)
Monocytes Relative: 5 % (ref 3–12)
RBC: 4.39 MIL/uL (ref 3.87–5.11)

## 2012-04-27 LAB — H. PYLORI ANTIBODY, IGG: H Pylori IgG: 0.43 {ISR}

## 2012-05-01 DIAGNOSIS — R109 Unspecified abdominal pain: Secondary | ICD-10-CM | POA: Insufficient documentation

## 2012-05-01 NOTE — Progress Notes (Signed)
  Subjective:    Patient ID: [name], female    DOB: [birth year], 41 y.o.   MRN: [id number]  HPI Pt presents to clinic for evaluation of abd pain. Notes postprandial abdominal pain with associated gas/belching and nausea. Has llose stools over the last month but no blood in stool. No f/c or wt loss. Taking no medication for the problem. No other alleviating or exacerbating factors.   Past Medical History  Diagnosis Date  . History of nephrolithiasis   . History of syncope     during pregnancy  . Chronic female pelvic pain     improved after hysterectomy   Past Surgical History  Procedure Date  . Appendectomy   . Tonsillectomy   . Vaginal hysterectomy   . Colonoscopy 2003    Dr. Gloriajean Dell    reports that she has been smoking.  She has never used smokeless tobacco. Her alcohol and drug histories not on file. family history includes Coronary artery disease in her paternal grandfather; Diabetes type II in her paternal grandfather; Hyperlipidemia in her paternal grandfather; Hypertension in an unspecified family member; and Uterine cancer in her mother.  There is no history of Breast cancer, and Prostate cancer, and Colon cancer, . No Known Allergies   Review of Systems see hpi     Objective:   Physical Exam  Nursing note and vitals reviewed. Constitutional: She appears well-developed and well-nourished. No distress.  HENT:  Head: Normocephalic and atraumatic.  Abdominal: Soft. Normal appearance and bowel sounds are normal. She exhibits no distension and no mass. There is no hepatosplenomegaly. There is no tenderness. There is no rebound and no guarding.  Neurological: She is alert.  Skin: Skin is warm and dry. She is not diaphoretic.  Psychiatric: She has a normal mood and affect.          Assessment & Plan:

## 2012-05-01 NOTE — Assessment & Plan Note (Signed)
Obtain cbc, chem7, amylase, lipase, lft and h pylori ab. Schedule abd [place]. Attempt samples of nexium 40mg  qd. Close follow up scheduled.

## 2012-05-02 ENCOUNTER — Ambulatory Visit (HOSPITAL_BASED_OUTPATIENT_CLINIC_OR_DEPARTMENT_OTHER)
Admission: RE | Admit: 2012-05-02 | Discharge: 2012-05-02 | Disposition: A | Discharge status: Home / Self Care | Payer: 59 | Source: Ambulatory Visit | Attending: Internal Medicine | Admitting: Internal Medicine

## 2012-05-02 DIAGNOSIS — Z9089 Acquired absence of other organs: Secondary | ICD-10-CM

## 2012-05-02 DIAGNOSIS — Z90710 Acquired absence of both cervix and uterus: Secondary | ICD-10-CM

## 2012-05-02 DIAGNOSIS — N289 Disorder of kidney and ureter, unspecified: Secondary | ICD-10-CM

## 2012-05-02 DIAGNOSIS — R1013 Epigastric pain: Secondary | ICD-10-CM

## 2012-05-05 ENCOUNTER — Ambulatory Visit (INDEPENDENT_AMBULATORY_CARE_PROVIDER_SITE_OTHER): Payer: 59 | Admitting: Internal Medicine

## 2012-05-05 ENCOUNTER — Encounter: Payer: Self-pay | Admitting: Internal Medicine

## 2012-05-05 DIAGNOSIS — D3002 Benign neoplasm of left kidney: Secondary | ICD-10-CM

## 2012-05-05 DIAGNOSIS — D1771 Benign lipomatous neoplasm of kidney: Secondary | ICD-10-CM | POA: Insufficient documentation

## 2012-05-05 DIAGNOSIS — R109 Unspecified abdominal pain: Secondary | ICD-10-CM

## 2012-05-05 MED ORDER — ESOMEPRAZOLE MAGNESIUM 40 MG PO CPDR: 40.0000 mg | DELAYED_RELEASE_CAPSULE | Freq: Every day | ORAL | Status: DC

## 2012-05-05 NOTE — Progress Notes (Signed)
  Subjective:    Patient ID: [name], female    DOB: 1971/03/03, 41 y.o.   MRN: [id number]  HPI Pt presents to clinic for follow up of abdominal pain. Notes recent improvement with nexium daily. abd [place] reviewed without GB abnormality noted. Reviewed angiomyolipoma of left kidney and reviewed to be stable from 2010. Labs including h. Pylori neg. No other complaints. Total time of visit ~20 minutes of which >50% spent in counseling.  Past Medical History  Diagnosis Date  . History of nephrolithiasis   . History of syncope     during pregnancy  . Chronic female pelvic pain     improved after hysterectomy   Past Surgical History  Procedure Date  . Appendectomy   . Tonsillectomy   . Vaginal hysterectomy   . Colonoscopy 2003    Dr. Gloriajean Dell    reports that she has been smoking.  She has never used smokeless tobacco. Her alcohol and drug histories not on file. family history includes Coronary artery disease in her paternal grandfather; Diabetes type II in her paternal grandfather; Hyperlipidemia in her paternal grandfather; Hypertension in an unspecified family member; and Uterine cancer in her mother.  There is no history of Breast cancer, and Prostate cancer, and Colon cancer, . No Known Allergies   Review of Systems see hpi    Objective:   Physical Exam  Nursing note and vitals reviewed. Constitutional: She appears well-developed and well-nourished. No distress.  Skin: She is not diaphoretic.          Assessment & Plan:

## 2012-05-05 NOTE — Assessment & Plan Note (Signed)
Improving with ppi. Extend course of nexium. Provided samples, prescription and savings card. If sx's do not resolve proceed with GI consult.

## 2012-05-05 NOTE — Assessment & Plan Note (Signed)
Reviewed and reassured of benign appearance as well as stability.

## 2012-06-28 ENCOUNTER — Telehealth: Payer: Self-pay | Admitting: *Deleted

## 2012-06-28 MED ORDER — VALACYCLOVIR HCL 1 G PO TABS: 1000.0000 mg | ORAL_TABLET | Freq: Two times a day (BID) | ORAL | Status: DC | PRN

## 2012-06-28 NOTE — Telephone Encounter (Signed)
I sent script e-scribe and left a voice message for pt. 

## 2012-06-28 NOTE — Telephone Encounter (Signed)
Patient reports that she has previously been px Valtrex for fever blisters, which she is having issues with at this time and request Rx for Valtrex; will come in for OV, if needed/SLS Please advise.

## 2012-06-28 NOTE — Telephone Encounter (Signed)
Valtrex 1000mg  take 2 po q12 hours for two doses prn fever blisters #20 rf4

## 2012-08-31 ENCOUNTER — Encounter (HOSPITAL_COMMUNITY): Payer: Self-pay | Admitting: *Deleted

## 2012-08-31 ENCOUNTER — Observation Stay (HOSPITAL_COMMUNITY)
Admission: EM | Admit: 2012-08-31 | Discharge: 2012-09-01 | Disposition: A | Discharge status: Home / Self Care | Payer: 59 | Attending: Emergency Medicine | Admitting: Emergency Medicine

## 2012-08-31 ENCOUNTER — Emergency Department (INDEPENDENT_AMBULATORY_CARE_PROVIDER_SITE_OTHER)
Admission: EM | Admit: 2012-08-31 | Discharge: 2012-08-31 | Disposition: A | Discharge status: Nursing Home (Includes ICF) | Payer: 59 | Source: Home / Self Care | Attending: Family Medicine | Admitting: Family Medicine

## 2012-08-31 ENCOUNTER — Emergency Department (HOSPITAL_COMMUNITY): Payer: 59

## 2012-08-31 DIAGNOSIS — R51 Headache: Secondary | ICD-10-CM

## 2012-08-31 DIAGNOSIS — R519 Headache, unspecified: Secondary | ICD-10-CM | POA: Diagnosis present

## 2012-08-31 DIAGNOSIS — R42 Dizziness and giddiness: Secondary | ICD-10-CM | POA: Insufficient documentation

## 2012-08-31 DIAGNOSIS — H538 Other visual disturbances: Secondary | ICD-10-CM

## 2012-08-31 DIAGNOSIS — H539 Unspecified visual disturbance: Secondary | ICD-10-CM

## 2012-08-31 DIAGNOSIS — H53469 Homonymous bilateral field defects, unspecified side: Secondary | ICD-10-CM | POA: Insufficient documentation

## 2012-08-31 LAB — COMPREHENSIVE METABOLIC PANEL
ALT: 10 U/L (ref 0–35)
AST: 12 U/L (ref 0–37)
Albumin: 3.2 g/dL — ABNORMAL LOW (ref 3.5–5.2)
Alkaline Phosphatase: 56 U/L (ref 39–117)
BUN: 9 mg/dL (ref 6–23)
CO2: 26 mEq/L (ref 19–32)
Calcium: 8.7 mg/dL (ref 8.4–10.5)
Chloride: 106 mEq/L (ref 96–112)
Creatinine, Ser: 0.8 mg/dL (ref 0.50–1.10)
GFR calc Af Amer: >90 mL/min (ref >90)
GFR calc non Af Amer: 89 mL/min — ABNORMAL LOW (ref >90)
Glucose, Bld: 137 mg/dL — ABNORMAL HIGH (ref 70–99)
Potassium: 3.7 mEq/L (ref 3.5–5.1)
Sodium: 139 mEq/L (ref 135–145)
Total Bilirubin: 0.2 mg/dL — ABNORMAL LOW (ref 0.3–1.2)
Total Protein: 5.9 g/dL — ABNORMAL LOW (ref 6.0–8.3)

## 2012-08-31 LAB — CBC
HCT: 36.9 % (ref 36.0–46.0)
Hemoglobin: 12.7 g/dL (ref 12.0–15.0)
MCH: 31.5 pg (ref 26.0–34.0)
MCHC: 34.4 g/dL (ref 30.0–36.0)
MCV: 91.6 fL (ref 78.0–100.0)
Platelets: 208 10*3/uL (ref 150–400)
RBC: 4.03 MIL/uL (ref 3.87–5.11)
RDW: 12.5 % (ref 11.5–15.5)
WBC: 7.8 10*3/uL (ref 4.0–10.5)

## 2012-08-31 LAB — LIPID PANEL
Cholesterol: 124 mg/dL (ref 0–200)
HDL: 37 mg/dL — ABNORMAL LOW (ref >39)
LDL Cholesterol: 70 mg/dL (ref 0–99)
Total CHOL/HDL Ratio: 3.4 RATIO
Triglycerides: 84 mg/dL (ref <150)
VLDL: 17 mg/dL (ref 0–40)

## 2012-08-31 LAB — PROTIME-INR
INR: 1.04 (ref 0.00–1.49)
Prothrombin Time: 13.8 seconds (ref 11.6–15.2)

## 2012-08-31 LAB — APTT: aPTT: 30 seconds (ref 24–37)

## 2012-08-31 MED ORDER — SODIUM CHLORIDE 0.9 % IV SOLN
1000.0000 mL | INTRAVENOUS | Status: DC
  Administered 2012-09-01: 1000 mL via INTRAVENOUS

## 2012-08-31 MED ORDER — DIPHENHYDRAMINE HCL 50 MG/ML IJ SOLN
25.0000 mg | Freq: Once | INTRAMUSCULAR | Status: AC
  Administered 2012-08-31: 25 mg via INTRAVENOUS
  Filled 2012-08-31: qty 1

## 2012-08-31 MED ORDER — DEXAMETHASONE SODIUM PHOSPHATE 10 MG/ML IJ SOLN
10.0000 mg | Freq: Once | INTRAMUSCULAR | Status: AC
  Administered 2012-08-31: 10 mg via INTRAVENOUS
  Filled 2012-08-31: qty 1

## 2012-08-31 MED ORDER — ONDANSETRON 4 MG PO TBDP
ORAL_TABLET | ORAL | Status: AC
  Filled 2012-08-31: qty 1

## 2012-08-31 MED ORDER — METOCLOPRAMIDE HCL 5 MG/ML IJ SOLN
5.0000 mg | Freq: Once | INTRAMUSCULAR | Status: AC
  Administered 2012-08-31: 5 mg via INTRAVENOUS
  Filled 2012-08-31: qty 2

## 2012-08-31 MED ORDER — ONDANSETRON 4 MG PO TBDP
4.0000 mg | ORAL_TABLET | Freq: Once | ORAL | Status: AC
  Administered 2012-08-31: 4 mg via ORAL

## 2012-08-31 MED ORDER — SODIUM CHLORIDE 0.9 % IV BOLUS (SEPSIS)
1000.0000 mL | INTRAVENOUS | Status: AC
  Administered 2012-08-31: 1000 mL via INTRAVENOUS

## 2012-08-31 NOTE — ED Provider Notes (Signed)
History     CSN: [id number]  Arrival date & time 08/31/12  1546   First MD Initiated Contact with Patient 08/31/12 2055      Chief Complaint  Patient presents with  . Migraine  . Dizziness    (Consider location/radiation/quality/duration/timing/severity/associated sxs/prior treatment) Patient is a 41 y.o. female presenting with headaches. The history is provided by the patient.  Headache  This is a new problem. Episode onset: 1 week ago. Episode frequency: waxing/waning. Progression since onset: waxing/waning, worse today. Associated with: unknown. Pain location: bitemporal. The quality of the pain is described as dull. The pain is at a severity of 4/10. The pain is mild. The pain does not radiate. Pertinent negatives include no fever, no shortness of breath, no nausea and no vomiting. She has tried acetaminophen for the symptoms. The treatment provided moderate relief.    Past Medical History  Diagnosis Date  . History of nephrolithiasis   . History of syncope     during pregnancy  . Chronic female pelvic pain     improved after hysterectomy    Past Surgical History  Procedure Date  . Appendectomy   . Tonsillectomy   . Vaginal hysterectomy   . Colonoscopy 2003    Dr. Gloriajean Dell    Family History  Problem Relation Age of Onset  . Uterine cancer Mother   . Diabetes type II Paternal Grandfather   . Coronary artery disease Paternal Grandfather   . Hyperlipidemia Paternal Grandfather   . Breast cancer Neg Hx   . Prostate cancer Neg Hx   . Colon cancer Neg Hx   . Hypertension      maternal & Paternal grandfather    History  Substance Use Topics  . Smoking status: Current Everyday Smoker  . Smokeless tobacco: Never Used   Comment: started in 1999-1/2 PPD  . Alcohol Use: No    OB History    Grav Para Term Preterm Abortions TAB SAB Ect Mult Living                  Review of Systems  Constitutional: Negative for fever and fatigue.  HENT: Negative for  congestion, drooling and neck pain.   Eyes: Negative for pain.  Respiratory: Negative for cough and shortness of breath.   Cardiovascular: Negative for chest pain.  Gastrointestinal: Negative for nausea, vomiting, abdominal pain and diarrhea.  Genitourinary: Negative for dysuria and hematuria.  Musculoskeletal: Negative for back pain and gait problem.  Skin: Negative for color change.  Neurological: Positive for headaches. Negative for dizziness.  Hematological: Negative for adenopathy.  Psychiatric/Behavioral: Negative for behavioral problems.  All other systems reviewed and are negative.    Allergies  Morphine and related  Home Medications   Current Outpatient Rx  Name Route Sig Dispense Refill  . ACETAMINOPHEN 325 MG PO TABS Oral Take 650 mg by mouth every 6 (six) hours as needed. For pain    . IBUPROFEN 200 MG PO TABS Oral Take 400 mg by mouth every 6 (six) hours as needed. For pain      BP 113/90  Pulse 87  Temp 98.9 F (37.2 C) (Oral)  Resp 14  SpO2 100%  Physical Exam  Nursing note and vitals reviewed. Constitutional: She is oriented to person, place, and time. She appears well-developed and well-nourished.  HENT:  Head: Normocephalic.  Mouth/Throat: Oropharynx is clear and moist. No oropharyngeal exudate.       TM's clear bilaterally.   Eyes: Conjunctivae and EOM are normal.  Pupils are equal, round, and reactive to light.  Neck: Normal range of motion. Neck supple.  Cardiovascular: Normal rate, regular rhythm, normal heart sounds and intact distal pulses.  Exam reveals no gallop and no friction rub.   No murmur heard. Pulmonary/Chest: Effort normal and breath sounds normal. No respiratory distress. She has no wheezes.  Abdominal: Soft. Bowel sounds are normal. There is no tenderness. There is no rebound and no guarding.  Musculoskeletal: Normal range of motion. She exhibits no edema and no tenderness.  Neurological: She is alert and oriented to person, place,  and time. She has normal strength. No cranial nerve deficit or sensory deficit. She displays a negative Romberg sign. Coordination and gait normal.       20/20 vision bilaterally on exam. Normal vision on peripheral field exam.   Skin: Skin is warm and dry.  Psychiatric: She has a normal mood and affect. Her behavior is normal.    ED Course  Procedures (including critical care time)  Labs Reviewed  COMPREHENSIVE METABOLIC PANEL - Abnormal; Notable for the following:    Glucose, Bld 137 (*)     Total Protein 5.9 (*)     Albumin 3.2 (*)     Total Bilirubin 0.2 (*)     GFR calc non Af Amer 89 (*)     All other components within normal limits  LIPID PANEL - Abnormal; Notable for the following:    HDL 37 (*)     All other components within normal limits  CBC  PROTIME-INR  APTT  URINALYSIS, ROUTINE W REFLEX MICROSCOPIC  HEMOGLOBIN A1C   Ct Head Wo Contrast  08/31/2012  *RADIOLOGY REPORT*  Clinical Data:  Headache.  Vision change.  Dizziness  CT HEAD WITHOUT CONTRAST  Technique:  Contiguous axial images were obtained from the base of the skull through the vertex without contrast  Comparison:  None.  Findings:  The brain has a normal appearance without evidence for hemorrhage, acute infarction, hydrocephalus, or mass lesion.  There is no extra axial fluid collection.  The skull and paranasal sinuses are normal.  IMPRESSION: Normal CT of the head without contrast.   Original Report Authenticated By: Camelia Phenes, M.D.      1. Headache   2. Quadrantanopia       MDM  12:35 AM 41 y.o. female pw waxing/waning bitemporal HA x 1 week.  Pt notes HA worse today and at noon had right eye RLQ vision loss which lasted approx 1 hr. Pt seen at urgent care and sent here. CT head neg.  Pt denies fever, tick contact, trauma. HA now 4/10. Pt AFVSS, neurologically intact on exam. Will give migraine cocktail.   Will place in CDU TIA protocol.   Clinical Impression 1. Headache   2. Quadrantanopia             Purvis Sheffield, MD 09/01/12 [phone]

## 2012-08-31 NOTE — ED Notes (Signed)
Pt  Reports  Symptoms  Of  Headache  With  Visual  Disturbances       And  dizzyness        X  4  Days  Symptoms  Much  More          Worse  Today  While     At  Work         -     She  denys  Any  History   Of  Any  Significant  Headaches       -   She reports  Basically  She  Is  In good  Health   And  Takes  No  Routine  meds  -   She     Reports  Some  Nausea  But  No  Vomiting

## 2012-08-31 NOTE — ED Provider Notes (Signed)
History     CSN: [id number]  Arrival date & time 08/31/12  1411   First MD Initiated Contact with Patient 08/31/12 1433      Chief Complaint  Patient presents with  . Headache    (Consider location/radiation/quality/duration/timing/severity/associated sxs/prior treatment) HPI Comments: 41 -year-old smoker female with no prior history of migraines. Here complaining of progressively worsening bitemporal headache in the last 4 days today he also associated with nausea, dizziness and right side visual impairment. Patient has taking Tylenol earlier today for her headache and reports transitorial loss of her external peripheral vision on the right eye at work today, associated with worsening headache and dizziness. She works at [employer] and a nurse recommended her to get into a dark room and was given ibuprofen, less than 4 hours ago. Headaches still 8/10 currently, still feels nauseous and dizzy. Visual impairment on the right side improving but is still reports peripheral blurry vision in the right eye. Reports  sinus congestion and rhinorrhea last week, now resolved. Denies extremity weakness or numbness. No falls. No difficulty understanding or making speech, no face droop. No dysuria or hematuria. No fever or chills. No palpitations or syncope.    Past Medical History  Diagnosis Date  . History of nephrolithiasis   . History of syncope     during pregnancy  . Chronic female pelvic pain     improved after hysterectomy    Past Surgical History  Procedure Date  . Appendectomy   . Tonsillectomy   . Vaginal hysterectomy   . Colonoscopy 2003    Dr. Gloriajean Dell    Family History  Problem Relation Age of Onset  . Uterine cancer Mother   . Diabetes type II Paternal Grandfather   . Coronary artery disease Paternal Grandfather   . Hyperlipidemia Paternal Grandfather   . Breast cancer Neg Hx   . Prostate cancer Neg Hx   . Colon cancer Neg Hx   . Hypertension      maternal &  Paternal grandfather    History  Substance Use Topics  . Smoking status: Current Everyday Smoker  . Smokeless tobacco: Never Used   Comment: started in 1999-1/2 PPD  . Alcohol Use: No    OB History    Grav Para Term Preterm Abortions TAB SAB Ect Mult Living                  Review of Systems  Constitutional: Negative for fever, chills and diaphoresis.  HENT: Positive for congestion and rhinorrhea. Negative for sore throat, neck pain and neck stiffness.   Eyes: Positive for photophobia and visual disturbance. Negative for pain, discharge and redness.  Respiratory: Negative for cough and shortness of breath.   Cardiovascular: Negative for chest pain, palpitations and leg swelling.  Gastrointestinal: Positive for nausea. Negative for vomiting, abdominal pain and diarrhea.  Genitourinary: Negative for dysuria, frequency and hematuria.  Skin: Negative for rash.  Neurological: Positive for dizziness and headaches. Negative for tremors, seizures, syncope, weakness and numbness.    Allergies  Morphine and related  Home Medications   Current Outpatient Rx  Name Route Sig Dispense Refill  . ESOMEPRAZOLE MAGNESIUM 40 MG PO CPDR Oral Take 1 capsule (40 mg total) by mouth daily. 30 capsule 6  . VALACYCLOVIR HCL 1 G PO TABS Oral Take 1 tablet (1,000 mg total) by mouth 2 (two) times daily as needed. 20 tablet 4    BP 127/68  Pulse 75  Temp 97.9 F (36.6 C) (Oral)  SpO2 98%  Physical Exam  Nursing note and vitals reviewed. Constitutional: She is oriented to person, place, and time. She appears well-developed and well-nourished. No distress.       Uncomfortable laying in bed while light is dimmed.  Rate in pain 8/10.  HENT:  Head: Normocephalic and atraumatic.  Right Ear: External ear normal.  Left Ear: External ear normal.  Nose: Nose normal.  Mouth/Throat: Oropharynx is clear and moist. No oropharyngeal exudate.        Nose: erythema of the nasal mucosa with no significant  swelling of the turbinates. No abuse rhinorrhea. TM 's : bilateral increased vascular markings over clear background. No dullness, swelling or bulging.  Eyes: Conjunctivae and EOM are normal. Pupils are equal, round, and reactive to light. Right eye exhibits no discharge. Left eye exhibits no discharge.  Neck: Normal range of motion. Neck supple.       No Kernig or Brudzinski's sign.  Cardiovascular: Normal rate, regular rhythm and normal heart sounds.  Exam reveals no gallop and no friction rub.   No murmur heard. Pulmonary/Chest: Effort normal and breath sounds normal. No respiratory distress. She has no wheezes. She has no rales. She exhibits no tenderness.  Lymphadenopathy:    She has no cervical adenopathy.  Neurological: She is alert and oriented to person, place, and time. She has normal strength and normal reflexes. No cranial nerve deficit or sensory deficit. She displays a negative Romberg sign. Coordination and gait normal.       Impress impairment in vision of the right lower peripheral field by comparison. No facial asymmetry. No arm drop. Normal rapid alternating movements.  Skin: No rash noted.    ED Course  Procedures (including critical care time)  Labs Reviewed - No data to display No results found.   1. Headache   2. Visual-spatial impairment       MDM  41 -year-old smoker female with no prior history of migraines. Here complaining of progressively worsening bitemporal headache in the last 4 days today pain is also associated with nausea, dizziness and right side visual impairment. Reports rhinorrhea last week now resolved. Vital signs normal and stable. On exam impress decreased peripheral vision on right lower visual field (by comparison). Otherwise no other focal neurological findings. Decided to transfer patient to the emergency department for further evaluation and management. Patient had administered ondansetron 4 mg sublingual prior to transfer to the  emergency department via shuttle. No other pain medication administered prior to transfer as patient reported allergies to narcotics and has recently taken ibuprofen and Tylenol.          Sharin Grave, MD 09/01/12 1135

## 2012-08-31 NOTE — ED Notes (Signed)
Chart check

## 2012-08-31 NOTE — ED Notes (Signed)
Pt states once symptoms started improving she noted floaters to right eye, pt went to urgent care for further evaluation, sent here for head CT due to no history of migraines and localized vision changes. Pt states headache is returning at this time.

## 2012-08-31 NOTE — ED Notes (Signed)
Pt reports headache x 4 days. Pt reports today while at work had episode of peripheral vision loss to right eye. Dizziness and nausea after vision started to mess up. Reports light sensitivity.

## 2012-09-01 ENCOUNTER — Observation Stay (HOSPITAL_COMMUNITY): Payer: 59

## 2012-09-01 DIAGNOSIS — H53469 Homonymous bilateral field defects, unspecified side: Secondary | ICD-10-CM | POA: Diagnosis present

## 2012-09-01 DIAGNOSIS — G459 Transient cerebral ischemic attack, unspecified: Secondary | ICD-10-CM

## 2012-09-01 DIAGNOSIS — R51 Headache: Secondary | ICD-10-CM | POA: Diagnosis present

## 2012-09-01 DIAGNOSIS — R519 Headache, unspecified: Secondary | ICD-10-CM | POA: Diagnosis present

## 2012-09-01 LAB — TROPONIN I: Troponin I: <0.3 ng/mL (ref <0.30)

## 2012-09-01 LAB — URINALYSIS, ROUTINE W REFLEX MICROSCOPIC
Bilirubin Urine: NEGATIVE
Glucose, UA: NEGATIVE mg/dL
Hgb urine dipstick: NEGATIVE
Ketones, ur: NEGATIVE mg/dL
Leukocytes, UA: NEGATIVE
Nitrite: NEGATIVE
Protein, ur: NEGATIVE mg/dL
Specific Gravity, Urine: 1.015 (ref 1.005–1.030)
Urobilinogen, UA: 1 mg/dL (ref 0.0–1.0)
pH: 6 (ref 5.0–8.0)

## 2012-09-01 LAB — HEMOGLOBIN A1C
Hgb A1c MFr Bld: 5.6 % (ref <5.7)
Mean Plasma Glucose: 114 mg/dL (ref <117)

## 2012-09-01 MED ORDER — ASPIRIN 81 MG PO CHEW
324.0000 mg | CHEWABLE_TABLET | Freq: Once | ORAL | Status: AC
  Administered 2012-09-01: 324 mg via ORAL
  Filled 2012-09-01: qty 4

## 2012-09-01 MED ORDER — ACETAMINOPHEN 325 MG PO TABS
650.0000 mg | ORAL_TABLET | Freq: Once | ORAL | Status: AC
  Administered 2012-09-01: 650 mg via ORAL
  Filled 2012-09-01: qty 2

## 2012-09-01 NOTE — ED Notes (Signed)
Pt has returned from vascular lab . 

## 2012-09-01 NOTE — ED Provider Notes (Signed)
Patient in CDU.  I was available for consult throughout the completion of her care.  Gerhard Munch, MD 09/01/12 1157

## 2012-09-01 NOTE — ED Provider Notes (Signed)
TIA protocol due to headache and dizziness.  Plan for brain MRI in AM.  Likely complicated migraine if MRI result is negative.    7:56 AM Pt reports she feels much better. Headache has improved, dizziness has resolved.  Pt acknowledge of having increase stressed from work and family and attributes her headache to it.  Her sister has hx of migraine that presents similar to hers.  On exam, Pt is A&Ox3, heart RRR, no M/R/G, lung CTAB, abd soft and nontender, palpable pulses to extremities x4, no focal neuro deficits.    MRI and MRA results are negative for stroke.  Currently awaits 2D echo and carotid doppler result.    10:07 AM Carotid doppler unremarkable, and 2D echo result is normal as well.  Pt were notified of result.  Pt agrees to f/u with PCP for further management.  Pt request work note.  Pt is stable to be discharge at this time.    Pt received aspirin, ABCD2 is <4   Fayrene Helper, PA-C 09/01/12 1024  Fayrene Helper, PA-C 09/01/12 1049

## 2012-09-01 NOTE — Progress Notes (Signed)
Observation review is complete. 

## 2012-09-01 NOTE — Progress Notes (Signed)
VASCULAR LAB PRELIMINARY  PRELIMINARY  PRELIMINARY  PRELIMINARY  Carotid Dopplers completed.    Preliminary report:  There is no ICA stenosis.  Vertebral artery flow is antegrade.  Casie Sturgeon, 09/01/2012, 8:47 AM

## 2012-09-01 NOTE — Progress Notes (Signed)
  Echocardiogram 2D Echocardiogram has been performed.  [name] 09/01/2012, 9:02 AM

## 2012-09-01 NOTE — ED Notes (Signed)
Patient resting comfortably on stretcher states headache feels better 3/10 achy dull pain. Ax4 answering and following commands appropriate.

## 2012-09-01 NOTE — ED Notes (Signed)
Patient not in room currently at test per previous nurse.  Family member at bedside.

## 2012-09-01 NOTE — ED Notes (Signed)
Pt given coffee await further tests husband at bedside

## 2012-09-01 NOTE — ED Notes (Signed)
Family at bedside. 

## 2012-09-01 NOTE — ED Notes (Signed)
Iv team here 

## 2012-09-08 NOTE — ED Provider Notes (Signed)
I saw and evaluated the patient, reviewed the resident's note and I agree with the findings and plan.  41 year old female with a headache and visual field deficit. Patient reports resolved visual complaints on my exam. She has no deficit to confrontation. The rest of her neurological exam is nonfocal as well. This may potentially be a complex migraine. Cannot rule out TIA though. Will place in the CDU under the TIA protocol.  Raeford Razor, MD 09/08/12 [phone]

## 2013-03-20 ENCOUNTER — Ambulatory Visit (HOSPITAL_BASED_OUTPATIENT_CLINIC_OR_DEPARTMENT_OTHER)
Admission: RE | Admit: 2013-03-20 | Discharge: 2013-03-20 | Disposition: A | Discharge status: Home / Self Care | Payer: 59 | Source: Ambulatory Visit | Attending: Family | Admitting: Family

## 2013-03-20 ENCOUNTER — Encounter: Payer: Self-pay | Admitting: Family

## 2013-03-20 ENCOUNTER — Ambulatory Visit (INDEPENDENT_AMBULATORY_CARE_PROVIDER_SITE_OTHER): Payer: 59 | Admitting: Family

## 2013-03-20 VITALS — BP 114/80 | HR 88 | Temp 98.1°F | Resp 16 | Wt 159.1 lb

## 2013-03-20 DIAGNOSIS — N281 Cyst of kidney, acquired: Secondary | ICD-10-CM | POA: Insufficient documentation

## 2013-03-20 DIAGNOSIS — R109 Unspecified abdominal pain: Secondary | ICD-10-CM

## 2013-03-20 LAB — HEPATIC FUNCTION PANEL
ALT: 16 U/L (ref 0–35)
AST: 16 U/L (ref 0–37)
Bilirubin, Direct: 0.1 mg/dL (ref 0.0–0.3)

## 2013-03-20 LAB — LIPASE: Lipase: 24 U/L (ref 0–75)

## 2013-03-20 NOTE — Progress Notes (Signed)
  Subjective:    Patient ID: [name], female    DOB: [birth year], 42 y.o.   MRN: [id number]  HPI  Chief complaint of abdominal pain.  Pain is located on the right side of the abdomen and is associated with gas, bloating and diarrhea x 5-6 months.  Reports that she saw Dr. Rodena Medin for the same. Took nexium without improvement.  Seems like symptoms have gradually worsened in the last 6 months.  She reports some sharp pains under the right ribs.  Reports that she has constipated.  Reports strong family hx of cholecystitis.  She denies fever, vomitting.    Pt reports right side abdominal pain. Notes gas, bloating and diarrhea x 5-6 months and worsening x 1 month.    Review of Systems See HPI  Past Medical History  Diagnosis Date  . History of nephrolithiasis   . History of syncope     during pregnancy  . Chronic female pelvic pain     improved after hysterectomy    History   Social History  . Marital Status: Single    Spouse Name: N/A    Number of Children: N/A  . Years of Education: N/A   Occupational History  . Not on file.   Social History Main Topics  . Smoking status: Current Every Day Smoker  . Smokeless tobacco: Never Used     Comment: started in 1999-1/2 PPD  . Alcohol Use: No  . Drug Use: Not on file  . Sexually Active: Not on file   Other Topics Concern  . Not on file   Social History Narrative   Engineer, water for [employer]   Married 6 years   Children-teenagers    Past Surgical History  Procedure Laterality Date  . Appendectomy    . Tonsillectomy    . Vaginal hysterectomy    . Colonoscopy  2003    Dr. Gloriajean Dell    Family History  Problem Relation Age of Onset  . Uterine cancer Mother   . Diabetes type II Paternal Grandfather   . Coronary artery disease Paternal Grandfather   . Hyperlipidemia Paternal Grandfather   . Breast cancer Neg Hx   . Prostate cancer Neg Hx   . Colon cancer Neg Hx   . Hypertension      maternal &  Paternal grandfather    Allergies  Allergen Reactions  . Morphine And Related     No current outpatient prescriptions on file prior to visit.   No current facility-administered medications on file prior to visit.    BP 114/80  Pulse 88  Temp(Src) 98.1 F (36.7 C) (Oral)  Resp 16  Wt 159 lb 1.3 oz (72.158 kg)  BMI 29.09 kg/m2  SpO2 99%       Objective:   Physical Exam        Assessment & Plan:

## 2013-03-20 NOTE — Patient Instructions (Addendum)
Please complete your lab work prior to leaving. Schedule abdominal ultrasound in imaging department on your way out. You will be contacted about your referral with GI. Please let us know if you have not heard back within 1 week about your referral.

## 2013-03-21 ENCOUNTER — Encounter: Payer: Self-pay | Admitting: Gastroenterology

## 2013-03-26 NOTE — Assessment & Plan Note (Addendum)
Abdominal US shows normal gallbladder.  LFT and lipase are normal. Refer to GI for further evaluation- suspect IBS.

## 2013-03-27 ENCOUNTER — Encounter: Payer: Self-pay | Admitting: Family

## 2013-03-27 NOTE — Telephone Encounter (Signed)
Patient requesting to have radiology & lab reports/results visible in My Chart/SLS

## 2013-04-10 ENCOUNTER — Ambulatory Visit: Payer: 59 | Admitting: Gastroenterology

## 2013-11-01 ENCOUNTER — Telehealth: Payer: Self-pay | Admitting: *Deleted

## 2013-11-01 NOTE — Telephone Encounter (Signed)
OK to send rx for nicotine inhaler, use as needed dispense 1 inhaler, refill prn.

## 2013-11-01 NOTE — Telephone Encounter (Signed)
Received message from [place] outpt pharmacy stating pt recently attended a smoking cessation class and would like to try a nicotine inhaler.  States they faxed request to [place] last week.?  Please advise.

## 2013-11-02 ENCOUNTER — Other Ambulatory Visit: Payer: Self-pay

## 2013-11-03 MED ORDER — NICOTINE 10 MG IN INHA: RESPIRATORY_TRACT | Status: DC

## 2013-11-03 NOTE — Telephone Encounter (Signed)
Rx sent 

## 2014-10-18 ENCOUNTER — Emergency Department (HOSPITAL_COMMUNITY)
Admission: EM | Admit: 2014-10-18 | Discharge: 2014-10-19 | Disposition: A | Discharge status: Home / Self Care | Payer: 59 | Source: Home / Self Care

## 2014-10-18 DIAGNOSIS — M26609 Unspecified temporomandibular joint disorder, unspecified side: Secondary | ICD-10-CM

## 2014-10-18 DIAGNOSIS — H6092 Unspecified otitis externa, left ear: Secondary | ICD-10-CM

## 2014-10-18 DIAGNOSIS — H6981 Other specified disorders of Eustachian tube, right ear: Secondary | ICD-10-CM

## 2014-10-19 ENCOUNTER — Encounter (HOSPITAL_COMMUNITY): Payer: Self-pay | Admitting: Emergency Medicine

## 2014-10-19 NOTE — ED Provider Notes (Signed)
CSN: [id number]     Arrival date & time 10/18/14  1629 History   None    No chief complaint on file.  (Consider location/radiation/quality/duration/timing/severity/associated sxs/prior Treatment) HPI  Past Medical History  Diagnosis Date  . History of nephrolithiasis   . History of syncope     during pregnancy  . Chronic female pelvic pain     improved after hysterectomy   Past Surgical History  Procedure Laterality Date  . Appendectomy    . Tonsillectomy    . Vaginal hysterectomy    . Colonoscopy  2003    Dr. Isa Rankin   Family History  Problem Relation Age of Onset  . Uterine cancer Mother   . Diabetes type II Paternal Grandfather   . Coronary artery disease Paternal Grandfather   . Hyperlipidemia Paternal Grandfather   . Breast cancer Neg Hx   . Prostate cancer Neg Hx   . Colon cancer Neg Hx   . Hypertension      maternal & Paternal grandfather   History  Substance Use Topics  . Smoking status: Current Every Day Smoker  . Smokeless tobacco: Never Used     Comment: started in 1999-1/2 PPD  . Alcohol Use: No   OB History   Grav Para Term Preterm Abortions TAB SAB Ect Mult Living                 Review of Systems  Allergies  Morphine and related  Home Medications   Prior to Admission medications   Medication Sig Start Date End Date Taking? Authorizing Provider  nicotine (NICOTROL) 10 MG inhaler Inhale 1 puff 8-12 times daily as needed for smoking cessation. 11/03/13   Debbrah Alar, NP   There were no vitals taken for this visit. Physical Exam  ED Course  Procedures (including critical care time) Labs Review Labs Reviewed - No data to display  Imaging Review No results found.   MDM  No diagnosis found.   NOTES ENTERED AS SCANNED DOCUMENTS DUE TO SYSTEM OUTAGE  Linna Darner, MD Family Medicine 10/19/2014, 12:03 PM    Waldemar Dickens, MD 10/19/14 [phone]

## 2014-10-19 NOTE — ED Notes (Signed)
Pt  Reports  Symptoms  Of     l   Earache  X  2  Weeks          Symptoms  Of  Full ness    As   Well

## 2014-11-08 ENCOUNTER — Ambulatory Visit (INDEPENDENT_AMBULATORY_CARE_PROVIDER_SITE_OTHER): Payer: 59 | Admitting: Nurse Practitioner

## 2014-11-08 ENCOUNTER — Encounter: Payer: Self-pay | Admitting: Nurse Practitioner

## 2014-11-08 VITALS — BP 119/60 | HR 74 | Temp 98.2°F | Resp 18 | Ht 62.0 in | Wt 160.0 lb

## 2014-11-08 DIAGNOSIS — G43809 Other migraine, not intractable, without status migrainosus: Secondary | ICD-10-CM

## 2014-11-08 DIAGNOSIS — G43909 Migraine, unspecified, not intractable, without status migrainosus: Secondary | ICD-10-CM | POA: Insufficient documentation

## 2014-11-08 MED ORDER — SUMATRIPTAN SUCCINATE 50 MG PO TABS: ORAL_TABLET | ORAL | Status: DC

## 2014-11-08 MED ORDER — ONDANSETRON HCL 4 MG PO TABS: 4.0000 mg | ORAL_TABLET | Freq: Three times a day (TID) | ORAL | Status: DC | PRN

## 2014-11-08 NOTE — Progress Notes (Signed)
Pre visit review using our clinic review tool, if applicable. No additional management support is needed unless otherwise documented below in the visit note. 

## 2014-11-08 NOTE — Patient Instructions (Signed)
Nice to meet you today.  Please fill the prescriptions and use as directed Please call if these do not help or the headaches worsen Seek emergency care if you have any red flags as discussed Keep track of symptoms on headache diary

## 2014-11-08 NOTE — Assessment & Plan Note (Signed)
Migraine in progress today with Aura. She was given Zofran prn for nausea and Imitrex for subsequent or recurrent migraine.

## 2014-11-08 NOTE — Progress Notes (Signed)
Subjective:    Patient ID: [name], female    DOB: [birth year], 43 y.o.   MRN: [id number]  HPI  [name] is a 43 yo female with a chief complaint of headaches.  Yesterday patient was driving down the road and parts of her vision, she reported, it turned yellow. Still having headache on left side today with ringing of the ears lasted a few hours. She does not currently document her symptoms/triggers. She has been diagnosed with Migraines. She was told to come today by supervisor.  New or old? Old problem When did they start? 08/2012 was seen in ED after visual changes Trauma? Shunt? History of cancer? Denies all Ever had one like this before? Yes about 3 x this year  Currently being seen by a neurologist or at a headache clinic? Denies Sudden or gradual? Suddenly Location: Temporal (left today unsure of past) Duration: Approximately 24 hours Radiation? In neck and into eye  Timing: Happens more often during work hours With or without aura? With Aura Triggers:  Menstruation- Hysterectomy (2006)   Foods- Avocados, wine, aged cheese? Denies  Position change? Denies  Stress? A lot of stress this past summer   Sleep deprivation? Denies  Hunger? Denies   Rx: OTC analgesics - Tylenol and Ibuprofen, opioids-None, nitrates-None, caffeine- 1 Cup of coffee in the am and 1 diet Dr. Malachi Bonds and sweet tea, Tobacco- 5 cig x day, ETOH- Occasionally on weekends beer or margarita x 1 What medications do you take when you get a HA? Tylenol and 2 Ibuprofen, this helps with severity.  Relieving factors? Ibuprofen, tylenol, and rest  Previous treatments? None Imaging? 2013 MRI Brain, MRA w/o contrast, and CT Head w/o contrast all reviewed today and results were negative.   Severity- 8/10 at worst. 4-5/10 at best BP: Controlled  Eye exam- Three-four months ago. Prescription did not change and reports normal exam.   Family History: Sister- Migraines       Review of  Systems Positive for photophobia, myalgias in legs, heavy feeling in eyes, nausea, and visual changes.  Patient denies the following: Phonophobia, sinus pain, eye pain, tearing, rhinorrhea, fevers, vomiting, vertigo, neck pain, and neck stiffness.   Past Medical History  Diagnosis Date  . History of nephrolithiasis   . History of syncope     during pregnancy  . Chronic female pelvic pain     improved after hysterectomy    History   Social History  . Marital Status: Single    Spouse Name: N/A    Number of Children: N/A  . Years of Education: N/A   Occupational History  . Not on file.   Social History Main Topics  . Smoking status: Current Every Day Smoker  . Smokeless tobacco: Never Used     Comment: 5 cigarettes daily  . Alcohol Use: 0.0 oz/week    0 Not specified per week     Comment: rarely  . Drug Use: Not on file  . Sexual Activity: Not on file   Other Topics Concern  . Not on file   Social History Narrative   Education officer, environmental for [employer]   Married 6 years   Children-teenagers    Past Surgical History  Procedure Laterality Date  . Appendectomy    . Tonsillectomy    . Vaginal hysterectomy    . Colonoscopy  2003    Dr. Isa Rankin    Family History  Problem Relation Age of Onset  . Uterine cancer Mother   .  Diabetes type II Paternal Grandfather   . Coronary artery disease Paternal Grandfather   . Hyperlipidemia Paternal Grandfather   . Breast cancer Neg Hx   . Prostate cancer Neg Hx   . Colon cancer Neg Hx   . Hypertension      maternal & Paternal grandfather    Allergies  Allergen Reactions  . Morphine And Related     No current outpatient prescriptions on file prior to visit.   No current facility-administered medications on file prior to visit.    BP 119/60 mmHg  Pulse 74  Temp(Src) 98.2 F (36.8 C) (Oral)  Resp 18  Ht 5\' 2"  (1.575 m)  Wt 160 lb (72.576 kg)  BMI 29.26 kg/m2  SpO2 99%       Objective:   Physical Exam    HEENT: Scalp/temples non-tender to palpation  Eyes: sclera are clear, no papilledema, no presence of tearing, no visual field defects on exam. PERRLA, EOM intact and without pain.   Ears/Nose: TMs are grey with light reflex, clear canals, turbinates are not boggy or edematous, no evidence of nasal discharge.  Sinus: Non tender to palpation  MSK: Neck has full ROM. Trapezius, sterno-cleido-mastoid muscles, and cervical vertebrae are non-tender to palpation.   Cardio: No bruits auscultated over carotids  Abdominal: Bowel sounds normal in all four quadrants, no tenderness to palpation, guarding, distention, or masses noted.   Neuro: Negative for nuchal rigidity, See eyes for EOM, Cranial nerves are grossly intact, strength equal in upper and lower extremities bilaterally, sensation equal bilaterally in face, upper, and lower extremities. DTRs are 2+ bilaterally in upper and lower extremities. Heel, toe, and tandem gait walking is normal. Negative Romberg, pronator drift; finger to nose and rapid alternating movements are intact.       Assessment & Plan:

## 2014-11-14 ENCOUNTER — Telehealth: Payer: Self-pay | Admitting: Family

## 2014-11-14 NOTE — Telephone Encounter (Signed)
emmi emailed °

## 2014-11-16 ENCOUNTER — Encounter: Payer: Self-pay | Admitting: Medical

## 2014-11-16 ENCOUNTER — Ambulatory Visit (INDEPENDENT_AMBULATORY_CARE_PROVIDER_SITE_OTHER): Payer: 59 | Admitting: Medical

## 2014-11-16 ENCOUNTER — Telehealth: Payer: Self-pay | Admitting: *Deleted

## 2014-11-16 VITALS — BP 125/79 | HR 76 | Temp 98.6°F | Ht 62.0 in | Wt 159.4 lb

## 2014-11-16 DIAGNOSIS — G43909 Migraine, unspecified, not intractable, without status migrainosus: Secondary | ICD-10-CM | POA: Insufficient documentation

## 2014-11-16 DIAGNOSIS — G43009 Migraine without aura, not intractable, without status migrainosus: Secondary | ICD-10-CM

## 2014-11-16 NOTE — Telephone Encounter (Signed)
Received FMLA paperwork via fax from [employer]. Patient needs FMLA forms filled out for migraines. Forms filled out as much as possible and forwarded to [employer] for completion. JG//CMA

## 2014-11-16 NOTE — Progress Notes (Signed)
Subjective:    Patient ID: [name], female    DOB: [birth year], 43 y.o.   MRN: [id number]  HPI   Pt states she has history migraines last 2 years. 1st HA 2 years ago was severe and  2 years ago ago and had full work up since some transient vision loss. Pt states has mild HA frequency about 1-2 time a month. But occasionally severe migraine HA every 3 months. She misses 2 days typically with each event. So she does want some fmla papers filled out. Pt states usually when she gets severe HA she just takes ibuprofen and just sleeps ha off. Pt states usually left side. Severe light sensititive. Nausea, but no vomiting.  No HA now.   Pt does have prescription of imitrex but she has not used.  LMP-Hysterectomy.  Past Medical History  Diagnosis Date  . History of nephrolithiasis   . History of syncope     during pregnancy  . Chronic female pelvic pain     improved after hysterectomy    History   Social History  . Marital Status: Single    Spouse Name: N/A    Number of Children: N/A  . Years of Education: N/A   Occupational History  . Not on file.   Social History Main Topics  . Smoking status: Current Every Day Smoker  . Smokeless tobacco: Never Used     Comment: 5 cigarettes daily  . Alcohol Use: 0.0 oz/week    0 Not specified per week     Comment: rarely  . Drug Use: Not on file  . Sexual Activity: Not on file   Other Topics Concern  . 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 Appendectomy    . Tonsillectomy    . Vaginal hysterectomy    . Colonoscopy  2003    Dr. Isa Rankin    Family History  Problem Relation Age of Onset  . Uterine cancer Mother   . Diabetes type II Paternal Grandfather   . Coronary artery disease Paternal Grandfather   . Hyperlipidemia Paternal Grandfather   . Breast cancer Neg Hx   . Prostate cancer  Neg Hx   . Colon cancer Neg Hx   . Hypertension      maternal & Paternal grandfather    Allergies  Allergen Reactions  . Morphine And Related     Current Outpatient Prescriptions on File Prior to Visit  Medication Sig Dispense Refill  . ondansetron (ZOFRAN) 4 MG tablet Take 1 tablet (4 mg total) by mouth every 8 (eight) hours as needed for nausea or vomiting. 20 tablet 0  . SUMAtriptan (IMITREX) 50 MG tablet Take 1 tablet by mouth at the beginning of a migraine. May repeat in 2 hours if headache persists or recurs. No more than 2 tablets in 24 hours. 10 tablet 0   No current facility-administered medications on file prior to visit.    BP 125/79 mmHg  Pulse 76  Temp(Src) 98.6 F (37 C) (Oral)  Ht 5\' 2"  (1.575 m)  Wt 159 lb 6.4 oz (72.303 kg)  BMI 29.15 kg/m2  SpO2 97%      Review of Systems  Constitutional: Negative for fever, chills and fatigue.  HENT: Negative.   Respiratory: Negative for cough, chest tightness, shortness of breath and wheezing.   Cardiovascular:  Negative for chest pain and palpitations.  Gastrointestinal: Negative.  Negative for vomiting.  Genitourinary: Negative for dysuria and flank pain.  Musculoskeletal: Negative for back pain.  Neurological: Negative for dizziness, tremors, seizures, syncope, weakness, light-headedness, numbness and headaches.       No ha presently.  Hematological: Negative for adenopathy. Does not bruise/bleed easily.  Psychiatric/Behavioral: Negative for suicidal ideas, behavioral problems and dysphoric mood. The patient is not nervous/anxious.        Objective:   Physical Exam   General Mental Status- Alert. General Appearance- Not in acute distress.   Skin General: Color- Normal Color. Moisture- Normal Moisture.  Neck Carotid Arteries- Normal color. Moisture- Normal Moisture. No carotid bruits. No JVD.  Chest and Lung Exam Auscultation: Breath Sounds:-Normal.  Cardiovascular Auscultation:Rythm-  Regular. Murmurs & Other Heart Sounds:Auscultation of the heart reveals- No Murmurs.  Abdomen Inspection:-Inspeection Normal. Palpation/Percussion:Note:No mass. Palpation and Percussion of the abdomen reveal- Non Tender, Non Distended + BS, no rebound or guarding.    Neurologic Cranial Nerve exam:- CN III-XII intact(No nystagmus), symmetric smile. Drift Test:- No drift. Romberg Exam:- Negative.  Heal to Toe Gait exam:-Normal. Finger to Nose:- Normal/Intact Strength:- 5/5 equal and symmetric strength both upper and lower extremities.       Assessment & Plan:

## 2014-11-16 NOTE — Assessment & Plan Note (Signed)
For your migraine HA history, I would recommend you using the imitrex.(your have rx) If your use and HA not resolved the be seen by a provider her, UC, or ED. If at any point severe HA with neurologic deficits/signs or symptoms as advised  then ED.  I can fill out fmla forms once they come in. I think estimation of 1 ha every 3 months with 3 days out from work is reasonable based on your description.

## 2014-11-16 NOTE — Progress Notes (Signed)
Pre visit review using our clinic review tool, if applicable. No additional management support is needed unless otherwise documented below in the visit note. 

## 2014-11-16 NOTE — Patient Instructions (Signed)
  For your migraine HA history, I would recommend you using the imitrex.(your have rx) If your use and HA not resolved the be seen by a provider her, UC, or ED. If at any point severe HA with neurologic deficits/signs or symptoms as advised  then ED.  I can fill out fmla forms once they come in. I think estimation of 1 ha every 3 months with 3 days out from work is reasonable based on your description.  Follow up in one month(woudl like to know how your ha responded to imitrex) or as needed

## 2014-11-26 DIAGNOSIS — Z7689 Persons encountering health services in other specified circumstances: Secondary | ICD-10-CM

## 2014-11-26 NOTE — Telephone Encounter (Signed)
Completed forms faxed to [employer] at [phone]. Fax confirmation received. JG//CMA

## 2014-11-30 ENCOUNTER — Ambulatory Visit (INDEPENDENT_AMBULATORY_CARE_PROVIDER_SITE_OTHER): Payer: 59 | Admitting: Medical

## 2014-11-30 ENCOUNTER — Encounter: Payer: Self-pay | Admitting: Medical

## 2014-11-30 VITALS — BP 108/70 | HR 70 | Temp 98.4°F | Wt 163.4 lb

## 2014-11-30 DIAGNOSIS — S46811A Strain of other muscles, fascia and tendons at shoulder and upper arm level, right arm, initial encounter: Secondary | ICD-10-CM

## 2014-11-30 DIAGNOSIS — S46819A Strain of other muscles, fascia and tendons at shoulder and upper arm level, unspecified arm, initial encounter: Secondary | ICD-10-CM | POA: Insufficient documentation

## 2014-11-30 MED ORDER — METHOCARBAMOL 500 MG PO TABS: 500.0000 mg | ORAL_TABLET | Freq: Four times a day (QID) | ORAL | Status: DC

## 2014-11-30 MED ORDER — DICLOFENAC SODIUM 75 MG PO TBEC: 75.0000 mg | DELAYED_RELEASE_TABLET | Freq: Two times a day (BID) | ORAL | Status: DC

## 2014-11-30 NOTE — Progress Notes (Signed)
Pre visit review using our clinic review tool, if applicable. No additional management support is needed unless otherwise documented below in the visit note. 

## 2014-11-30 NOTE — Progress Notes (Signed)
Subjective:    Patient ID: [name], female    DOB: 1971-06-25, 43 y.o.   MRN: [id number]  HPI   Pt has some rt trapezius area and  rtscapular region pain for 6 days. Lifting large bin filled with books 6 days ago. Lifted in awkward position. Pt pain when she strained later pain gradually got worse. Pain start in back but now some mild pain rt arm that radiates from scapula. Pt does not report neck pain. But on range of motion describes rt trapezius pain with some scapula pain. Then pain that radiates toward rt upper arm.  Pt work with OT and they have advised some tx but not helping much.  Pain level 5/10. Pain level is some decreased from first day.  Past Medical History  Diagnosis Date  . History of nephrolithiasis   . History of syncope     during pregnancy  . Chronic female pelvic pain     improved after hysterectomy    History   Social History  . Marital Status: Single    Spouse Name: N/A    Number of Children: N/A  . Years of Education: N/A   Occupational History  . Not on file.   Social History Main Topics  . Smoking status: Current Every Day Smoker  . Smokeless tobacco: Never Used     Comment: 5 cigarettes daily  . Alcohol Use: 0.0 oz/week    0 Not specified per week     Comment: rarely  . Drug Use: Not on file  . Sexual Activity: Not on file   Other Topics Concern  . 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 Appendectomy    . Tonsillectomy    . Vaginal hysterectomy    . Colonoscopy  2003    Dr. Isa Rankin    Family History  Problem Relation Age of Onset  . Uterine cancer Mother   . Diabetes type II Paternal Grandfather   . Coronary artery disease Paternal Grandfather   . Hyperlipidemia Paternal Grandfather   . Breast cancer Neg Hx   . Prostate cancer Neg Hx   . Colon cancer Neg Hx   . Hypertension     maternal & Paternal grandfather    Allergies  Allergen Reactions  . Morphine And Related     Current Outpatient Prescriptions on File Prior to Visit  Medication Sig Dispense Refill  . ondansetron (ZOFRAN) 4 MG tablet Take 1 tablet (4 mg total) by mouth every 8 (eight) hours as needed for nausea or vomiting. 20 tablet 0  . SUMAtriptan (IMITREX) 50 MG tablet Take 1 tablet by mouth at the beginning of a migraine. May repeat in 2 hours if headache persists or recurs. No more than 2 tablets in 24 hours. 10 tablet 0   No current facility-administered medications on file prior to visit.    There were no vitals taken for this visit.       Review of Systems  Constitutional: Negative for fever, chills and fatigue.  Respiratory: Negative for wheezing.   Cardiovascular: Negative for chest pain and palpitations.  Musculoskeletal: Positive for back pain. Negative for neck pain.       Rt trapezius and scapula region pain.  Also some rt arm pain.  Neurological: Negative for dizziness and headaches.       Occasinoal  radiating pain toward rt arm from scapula region but not from the neck.  Hematological: Negative for adenopathy. Does not bruise/bleed easily.       Objective:   Physical Exam  General- NO acute distress. Lungs- CTA Heart- RRR Neck- no mid cspine pain on palpation. Rt trapezius tender thoughout mostly medial to scapula. and suprascapular pain on palpation mild.. Rt Shoulder- no direct pain on palpation of the rt shoulder. No crepitus. On rom of the shoulder/abudction she has pain in trapezius and scapula. Rt arm- no pain on palpation. But reports with trapezius and scapula movement some pain in rt tricep region         Assessment & Plan:

## 2014-11-30 NOTE — Patient Instructions (Signed)
You have trapezius strain with some strain of suprascapular muscles. I will prescribe diclofenac nsaid and muscle relaxant  robaxin.  Rest and do exercises OT have shown you. If you pain persist or worsens the may need to expand workup and treatment.  Follow up in 10-14 days or as needed.

## 2014-11-30 NOTE — Assessment & Plan Note (Signed)
Trapezius strain with some strain of suprascapular muscles. I will prescribe diclofenac nsaid and muscle relaxant  robaxin.  Rest and do exercises OT have shown you. If you pain persist or worsens the may need to expand workup and treatment.

## 2014-12-03 ENCOUNTER — Telehealth: Payer: Self-pay | Admitting: Internal Medicine

## 2014-12-03 NOTE — Telephone Encounter (Signed)
emmi emailed °

## 2015-04-12 ENCOUNTER — Telehealth: Payer: Self-pay | Admitting: *Deleted

## 2015-04-12 MED ORDER — NICOTINE 10 MG IN INHA: RESPIRATORY_TRACT | Status: DC

## 2015-04-12 NOTE — Telephone Encounter (Signed)
Ok to send rx

## 2015-04-12 NOTE — Telephone Encounter (Signed)
Rx sent 

## 2015-04-12 NOTE — Telephone Encounter (Signed)
Received fax from [place] pharmacy requesting rx of nicotro; cartridge inhaler. Inhale 1 puff 8-12 times daily as needed for somking cessation. States pt filled rx once in 2014 and wasn't able to quit at that time but is ready to make another attempt and is requesting refill.  Please advise.

## 2015-10-14 ENCOUNTER — Ambulatory Visit (INDEPENDENT_AMBULATORY_CARE_PROVIDER_SITE_OTHER): Payer: 59 | Admitting: Medical

## 2015-10-14 ENCOUNTER — Ambulatory Visit: Payer: 59 | Admitting: Medical

## 2015-10-14 ENCOUNTER — Encounter: Payer: Self-pay | Admitting: Medical

## 2015-10-14 ENCOUNTER — Ambulatory Visit (HOSPITAL_BASED_OUTPATIENT_CLINIC_OR_DEPARTMENT_OTHER)
Admission: RE | Admit: 2015-10-14 | Discharge: 2015-10-14 | Disposition: A | Discharge status: Home / Self Care | Payer: 59 | Source: Ambulatory Visit | Attending: Medical | Admitting: Medical

## 2015-10-14 VITALS — BP 108/70 | HR 65 | Temp 98.1°F | Resp 16 | Ht 62.0 in | Wt 158.0 lb

## 2015-10-14 DIAGNOSIS — M25512 Pain in left shoulder: Secondary | ICD-10-CM | POA: Insufficient documentation

## 2015-10-14 DIAGNOSIS — H60392 Other infective otitis externa, left ear: Secondary | ICD-10-CM

## 2015-10-14 DIAGNOSIS — H6982 Other specified disorders of Eustachian tube, left ear: Secondary | ICD-10-CM

## 2015-10-14 DIAGNOSIS — G8929 Other chronic pain: Secondary | ICD-10-CM | POA: Diagnosis not present

## 2015-10-14 MED ORDER — AZELASTINE HCL 0.1 % NA SOLN: 2.0000 | Freq: Two times a day (BID) | NASAL | Status: DC

## 2015-10-14 MED ORDER — FLUTICASONE PROPIONATE 50 MCG/ACT NA SUSP: 2.0000 | Freq: Every day | NASAL | Status: DC

## 2015-10-14 MED ORDER — NEOMYCIN-POLYMYXIN-HC 1 % OT SOLN: 3.0000 [drp] | Freq: Four times a day (QID) | OTIC | Status: DC

## 2015-10-14 NOTE — Progress Notes (Signed)
Pre visit review using our clinic review tool, if applicable. No additional management support is needed unless otherwise documented below in the visit note. 

## 2015-10-14 NOTE — Patient Instructions (Signed)
For your ear pain rx cortisporin otic drops.  For your probable eustachian tube dysfunction related to mild allergies rx astelin and can continue flonase but 1 spray each nostril.  If ear pain same or worse by wed consider oral antibiotic.  For left shoulder will get xray and refer you to ortho.  Follow up in 2 wks or as needed.

## 2015-10-14 NOTE — Progress Notes (Signed)
Subjective:    Patient ID: [name], female    DOB: [birth year], 44 y.o.   MRN: [id number]  HPI  Pt in with left ear feeling clogged. Some mild- moderate pain. Pt states some pnd. Mild congestion. Pt states some throbbing over the weekend.  Slight sneezing. Pt is on zyrtec. Pt does not use flonase but has used this before but makes hyper.   Pt states remote hx of eczema in her ear. She states off and for years.   At end noticed left shoulder pain. For one year. At first pain would come and go. Now daily. Old injury skiing.   Review of Systems  Constitutional: Negative for fever, chills, diaphoresis, activity change and fatigue.  HENT: Positive for congestion, ear pain and sneezing. Negative for drooling, hearing loss, nosebleeds, postnasal drip, sinus pressure and sore throat.   Respiratory: Negative for cough, chest tightness and shortness of breath.   Cardiovascular: Negative for chest pain and palpitations.  Gastrointestinal: Negative for nausea, vomiting and abdominal pain.  Musculoskeletal: Negative for back pain, neck pain and neck stiffness.       Lt shoulder pain.  Neurological: Negative for dizziness, seizures, weakness and headaches.  Hematological: Negative.   Psychiatric/Behavioral: Negative for behavioral problems, confusion and agitation. The patient is not nervous/anxious.     Past Medical History  Diagnosis Date  . History of nephrolithiasis   . History of syncope     during pregnancy  . Chronic female pelvic pain     improved after hysterectomy    Social History   Social History  . Marital Status: Single    Spouse Name: N/A  . Number of Children: N/A  . Years of Education: N/A   Occupational History  . Not on file.   Social History Main Topics  . Smoking status: Current Every Day Smoker  . Smokeless tobacco: Never Used     Comment: 5 cigarettes daily  . Alcohol Use: 0.0 oz/week    0 Standard drinks or equivalent per week     Comment:  rarely  . Drug Use: Not on file  . Sexual Activity: Not on file   Other Topics Concern  . Not on file   Social History Narrative   Education officer, environmental for [employer]   Married 6 years   Children-teenagers    Past Surgical History  Procedure Laterality Date  . Appendectomy    . Tonsillectomy    . Vaginal hysterectomy    . Colonoscopy  2003    Dr. Isa Rankin    Family History  Problem Relation Age of Onset  . Uterine cancer Mother   . Diabetes type II Paternal Grandfather   . Coronary artery disease Paternal Grandfather   . Hyperlipidemia Paternal Grandfather   . Breast cancer Neg Hx   . Prostate cancer Neg Hx   . Colon cancer Neg Hx   . Hypertension      maternal & Paternal grandfather    Allergies  Allergen Reactions  . Morphine And Related     No current outpatient prescriptions on file prior to visit.   No current facility-administered medications on file prior to visit.    BP 108/70 mmHg  Pulse 65  Temp(Src) 98.1 F (36.7 C) (Oral)  Resp 16  Ht 5\' 2"  (1.575 m)  Wt 158 lb (71.668 kg)  BMI 28.89 kg/m2  SpO2 99%    Past Medical History  Diagnosis Date  . History of nephrolithiasis   . History  of syncope     during pregnancy  . Chronic female pelvic pain     improved after hysterectomy    Social History   Social History  . Marital Status: Single    Spouse Name: N/A  . Number of Children: N/A  . Years of Education: N/A   Occupational History  . Not on file.   Social History Main Topics  . Smoking status: Current Every Day Smoker  . Smokeless tobacco: Never Used     Comment: 5 cigarettes daily  . Alcohol Use: 0.0 oz/week    0 Standard drinks or equivalent per week     Comment: rarely  . Drug Use: Not on file  . Sexual Activity: Not on file   Other Topics Concern  . Not on file   Social History Narrative   Education officer, environmental for [employer]   Married 6 years   Children-teenagers    Past Surgical History  Procedure Laterality Date  .  Appendectomy    . Tonsillectomy    . Vaginal hysterectomy    . Colonoscopy  2003    Dr. Isa Rankin    Family History  Problem Relation Age of Onset  . Uterine cancer Mother   . Diabetes type II Paternal Grandfather   . Coronary artery disease Paternal Grandfather   . Hyperlipidemia Paternal Grandfather   . Breast cancer Neg Hx   . Prostate cancer Neg Hx   . Colon cancer Neg Hx   . Hypertension      maternal & Paternal grandfather    Allergies  Allergen Reactions  . Morphine And Related     No current outpatient prescriptions on file prior to visit.   No current facility-administered medications on file prior to visit.    BP 108/70 mmHg  Pulse 65  Temp(Src) 98.1 F (36.7 C) (Oral)  Resp 16  Ht 5\' 2"  (1.575 m)  Wt 158 lb (71.668 kg)  BMI 28.89 kg/m2  SpO2 99%       Objective:   Physical Exam  General  Mental Status - Alert. General Appearance - Well groomed. Not in acute distress.  Skin Rashes- No Rashes.  HEENT Head- Normal. Ear Auditory Canal - Left-  Some mild pain on insertion of otoscope. . Right - Normal.Tympanic Membrane- Left- Normal except faint red at top edge of tm. Right- Normal. Eye Sclera/Conjunctiva- Left- Normal. Right- Normal. Nose & Sinuses Nasal Mucosa- Left-  Boggy and Congested. Right-  Boggy and  Congested.Bilateral no  maxillary and frontal no  sinus pressure. Mouth & Throat Lips: Upper Lip- Normal: no dryness, cracking, pallor, cyanosis, or vesicular eruption. Lower Lip-Normal: no dryness, cracking, pallor, cyanosis or vesicular eruption. Buccal Mucosa- Bilateral- No Aphthous ulcers. Oropharynx- No Discharge or Erythema. Tonsils: Characteristics- Bilateral- No Erythema or Congestion. Size/Enlargement- Bilateral- No enlargement. Discharge- bilateral-None.  Neck Neck- Supple. No Masses.   Chest and Lung Exam Auscultation: Breath Sounds:-Clear even and unlabored.  Cardiovascular Auscultation:Rythm- Regular, rate and  rhythm. Murmurs & Other Heart Sounds:Ausculatation of the heart reveal- No Murmurs.  Lymphatic Head & Neck General Head & Neck Lymphatics: Bilateral: Description- No Localized lymphadenopathy.   Neurologic Cranial Nerve exam:- CN III-XII intact(No nystagmus), symmetric smile. Strength:- 5/5 equal and symmetric strength both upper and lower extremities.  Lt shoulder- pain on abuction. Top aspect is mild tender.      Assessment & Plan:  For your ear pain rx cortisporin otic drops.  For your probable eustachian tube dysfunction related to mild allergies rx  astelin and can continue flonase but 1 spray each nostril.  If ear pain same or worse by wed consider oral antibiotic.  For left shoulder will get xray and refer you to ortho.  Follow up in 2 wks or as needed.

## 2015-10-28 ENCOUNTER — Telehealth: Payer: Self-pay | Admitting: Internal Medicine

## 2015-10-28 NOTE — Telephone Encounter (Signed)
Edward please advise on medication and if pt will need to be seen again.  Pt was last seen on 10/14/15.

## 2015-10-28 NOTE — Telephone Encounter (Signed)
Relation to JX:BJYN Call back number:[phone] Pharmacy: [facility], [place]. [phone] (Phone) [phone] (Fax)         Reason for call:  Patient is congested and using the inhaler and taking zyrtec, ear pain. Patient was informed by PA if symptoms did not improve PA will call in antibiotics

## 2015-10-28 NOTE — Telephone Encounter (Signed)
Patient needs meds sent to [facility] on [address] was informed at last visit if she had issues call back for rx

## 2015-10-29 ENCOUNTER — Telehealth: Payer: Self-pay | Admitting: Medical

## 2015-10-29 MED ORDER — AMOXICILLIN-POT CLAVULANATE 875-125 MG PO TABS: 1.0000 | ORAL_TABLET | Freq: Two times a day (BID) | ORAL | Status: DC

## 2015-10-29 NOTE — Addendum Note (Signed)
Addended by: Tasia Catchings on: 10/29/2015 10:50 AM   Modules accepted: Orders

## 2015-10-29 NOTE — Telephone Encounter (Signed)
Pt called called yesterday. It appears her phone call was handled within less than 24 hours. In fact handled immediatly when brought to my attention. She expressed frustration with our response time?

## 2015-10-29 NOTE — Telephone Encounter (Signed)
Pt was made aware of medication refill. Pt was not happy about the wait and I apologized to her for the delay. Informed to that if she is not any better by Friday to follow up with [name].

## 2015-10-29 NOTE — Telephone Encounter (Signed)
Pt called in to follow up on message sent. We were disconnected and unable to reach pt back.

## 2015-10-31 ENCOUNTER — Telehealth: Payer: Self-pay

## 2015-10-31 NOTE — Telephone Encounter (Signed)
Pt called and stated that antibiotic was making her stomach hurt and she was having some loose stools. Please advise if you would like her to come in for an a evaluation.

## 2015-10-31 NOTE — Telephone Encounter (Signed)
Would ask she come in for evaluation.

## 2015-10-31 NOTE — Telephone Encounter (Signed)
Pt was advised to come in for an appointment and she declined. Advised her to call back if she was not feeling any better by tomorrow afternoon.

## 2016-01-01 ENCOUNTER — Telehealth: Payer: Self-pay

## 2016-01-01 MED ORDER — VALACYCLOVIR HCL 1 G PO TABS: 1000.0000 mg | ORAL_TABLET | Freq: Two times a day (BID) | ORAL | Status: DC

## 2016-01-01 MED FILL — valACYclovir HCL 1 GM TABS: 1 | 7 days supply | Qty: 14 | Fill #0

## 2016-01-01 NOTE — Telephone Encounter (Signed)
Please advise on refill. Last refill was 06/03/2013. Last OV 10/14/15.

## 2016-01-01 NOTE — Telephone Encounter (Signed)
Spoke with pt and she states that she has two bumps on her lip and it has made her neck feel swollen and states that it started about two days ago. She says she only gets them when she is about to be sick or stressed. She states that she may not have time to come in for an appointment but she would like something to help.

## 2016-01-01 NOTE — Telephone Encounter (Signed)
I have never seen her for this particular problem. Last rx in 2014 so I did not write that rx. I have seen her twice for other condition. So I need to know what symptom she is having currently, where, and how many days. Let her know she made need to be seen in office. Need this info first before deciding.

## 2016-01-01 NOTE — Telephone Encounter (Signed)
I will refill her vatlrex. But explain to pt and document that her neck complaint does not add up unless it is regional lymph node swelling in response to viral infection(cold sore). Can you look at Friday schedule and offer her late Friday appointment if we have one. If we don't have one or she declines then advise her if any worsening signs or symptoms then ED or urgent care evaluation.

## 2016-01-01 NOTE — Telephone Encounter (Signed)
Spoke with pt and she voices understanding.  

## 2016-03-16 DIAGNOSIS — H10023 Other mucopurulent conjunctivitis, bilateral: Secondary | ICD-10-CM | POA: Diagnosis not present

## 2016-03-16 DIAGNOSIS — J018 Other acute sinusitis: Secondary | ICD-10-CM | POA: Diagnosis not present

## 2016-03-16 MED FILL — AMOX TR-K CLV 875-125 MG TA: 875-125 | 7 days supply | Qty: 14 | Fill #0

## 2016-03-16 MED FILL — TOBRAMYCIN 0.3% EYE DROPS: 0.3 | 7 days supply | Qty: 5 | Fill #0

## 2016-04-13 DIAGNOSIS — H04129 Dry eye syndrome of unspecified lacrimal gland: Secondary | ICD-10-CM | POA: Diagnosis not present

## 2016-04-13 DIAGNOSIS — H1033 Unspecified acute conjunctivitis, bilateral: Secondary | ICD-10-CM | POA: Diagnosis not present

## 2016-04-13 MED FILL — PREDNISOLONE AC 1% EYE DROP: 1 | 7 days supply | Qty: 5 | Fill #0

## 2016-04-24 ENCOUNTER — Other Ambulatory Visit: Payer: Self-pay | Admitting: Medical

## 2016-04-27 MED FILL — valACYclovir HCL 1 GM TABS: 1 | 7 days supply | Qty: 14 | Fill #0

## 2016-06-05 DIAGNOSIS — H5213 Myopia, bilateral: Secondary | ICD-10-CM | POA: Diagnosis not present

## 2016-06-12 ENCOUNTER — Other Ambulatory Visit: Payer: Self-pay | Admitting: Ophthalmology

## 2016-06-12 DIAGNOSIS — D239 Other benign neoplasm of skin, unspecified: Secondary | ICD-10-CM | POA: Diagnosis not present

## 2016-06-12 DIAGNOSIS — H11441 Conjunctival cysts, right eye: Secondary | ICD-10-CM | POA: Diagnosis not present

## 2016-06-12 MED FILL — OFLOXACIN 0.3% EYE DROPS: 0.3 | 25 days supply | Qty: 5 | Fill #0

## 2016-06-16 MED FILL — FLUOROMETHOLONE 0.1% DROPS: 0.1 | 25 days supply | Qty: 5 | Fill #0

## 2016-06-26 ENCOUNTER — Ambulatory Visit (HOSPITAL_COMMUNITY)
Admission: EM | Admit: 2016-06-26 | Discharge: 2016-06-26 | Disposition: A | Discharge status: Home / Self Care | Payer: 59 | Attending: Emergency Medicine | Admitting: Emergency Medicine

## 2016-06-26 ENCOUNTER — Encounter (HOSPITAL_COMMUNITY): Payer: Self-pay | Admitting: Emergency Medicine

## 2016-06-26 DIAGNOSIS — S39011A Strain of muscle, fascia and tendon of abdomen, initial encounter: Secondary | ICD-10-CM

## 2016-06-26 LAB — POCT URINALYSIS DIP (DEVICE)
Bilirubin Urine: NEGATIVE
GLUCOSE, UA: NEGATIVE mg/dL
Ketones, ur: NEGATIVE mg/dL
Leukocytes, UA: NEGATIVE
NITRITE: NEGATIVE
PROTEIN: NEGATIVE mg/dL
Specific Gravity, Urine: <=1.005 (ref 1.005–1.030)
UROBILINOGEN UA: 0.2 mg/dL (ref 0.0–1.0)
pH: 6 (ref 5.0–8.0)

## 2016-06-26 NOTE — ED Provider Notes (Signed)
CSN: [id number]     Arrival date & time 06/26/16  1652 History   First MD Initiated Contact with Patient 06/26/16 1707     Chief Complaint  Patient presents with  . Abdominal Pain   (Consider location/radiation/quality/duration/timing/severity/associated sxs/prior Treatment) HPI  She is a 45 year old woman here for evaluation of right lower abdominal pain. She states this started suddenly Wednesday night when she rolled over in bed. She reports an excruciating pain but doesn't last very long. It only occurs with certain movements and coughing. She reports some mild discomfort in the right lower back as well. No urinary symptoms including dysuria, hematuria, frequency, or urgency. She just got back from vacation at the beach where she did a lot of water skiing.  She has also had some vague nausea and loose stools the last few days. No fevers. She does have a history of kidney stones.  She has had an appendectomy and hysterectomy.  Past Medical History  Diagnosis Date  . History of nephrolithiasis   . History of syncope     during pregnancy  . Chronic female pelvic pain     improved after hysterectomy   Past Surgical History  Procedure Laterality Date  . Appendectomy    . Tonsillectomy    . Vaginal hysterectomy    . Colonoscopy  2003    Dr. Isa Rankin   Family History  Problem Relation Age of Onset  . Uterine cancer Mother   . Diabetes type II Paternal Grandfather   . Coronary artery disease Paternal Grandfather   . Hyperlipidemia Paternal Grandfather   . Breast cancer Neg Hx   . Prostate cancer Neg Hx   . Colon cancer Neg Hx   . Hypertension      maternal & Paternal grandfather   Social History  Substance Use Topics  . Smoking status: Current Every Day Smoker -- 0.50 packs/day    Types: Cigarettes  . Smokeless tobacco: Never Used     Comment: 5 cigarettes daily  . Alcohol Use: 0.0 oz/week    0 Standard drinks or equivalent per week     Comment: rarely   OB History    No data available     Review of Systems As in history of present illness Allergies  Morphine and related  Home Medications   Prior to Admission medications   Medication Sig Start Date End Date Taking? Authorizing Provider  azelastine (ASTELIN) 0.1 % nasal spray Place 2 sprays into both nostrils 2 (two) times daily. Use in each nostril as directed 10/14/15   Mackie Pai, PA-C  fluticasone [facility]) 50 MCG/ACT nasal spray Place 2 sprays into both nostrils daily. 10/14/15   Edward Saguier, PA-C  NEOMYCIN-POLYMYXIN-HYDROCORTISONE (CORTISPORIN) 1 % SOLN otic solution Place 3 drops into the left ear every 6 (six) hours. 10/14/15   [name] Saguier, PA-C  valACYclovir (VALTREX) 1000 MG tablet TAKE 1 TABLET BY MOUTH 2 TIMES DAILY. 04/27/16   Mackie Pai, PA-C   Meds Ordered and Administered this Visit  Medications - No data to display  BP 120/66 mmHg  Pulse 70  Temp(Src) 98.6 F (37 C) (Oral)  Resp 16  SpO2 100% No data found.   Physical Exam  Constitutional: She is oriented to person, place, and time. She appears well-developed and well-nourished. No distress.  Neck: Neck supple.  Cardiovascular: Normal rate.   Pulmonary/Chest: Effort normal.  Abdominal: Soft. Bowel sounds are normal. She exhibits no distension and no mass. There is tenderness (point tenderness in right lower  quadrant. This is present with and without abdominal wall contraction. Contraction of the abdominal wall muscles triggers her pain.). There is no rebound and no guarding.  Neurological: She is alert and oriented to person, place, and time.    ED Course  Procedures (including critical care time)  Labs Review Labs Reviewed  POCT URINALYSIS DIP (DEVICE) - Abnormal; Notable for the following:    Hgb urine dipstick TRACE (*)    All other components within normal limits    Imaging Review No results found.   MDM   1. Strain of abdominal muscle, initial encounter    Exam is consistent with abdominal  wall muscle strain. UA does have trace hemoglobin, but pain is not consistent with kidney stone. Symptomatic treatment with ice/heat and ibuprofen. Expect improvement over the next 2 weeks. Return precautions reviewed.    Melony Overly, MD 06/26/16 1739

## 2016-06-26 NOTE — ED Notes (Signed)
Pt d/c by Dr. Honig 

## 2016-06-26 NOTE — ED Notes (Signed)
C/o RLQ pain onset Wednesday that has been constant and increases w/movement and coughing Hx of appendectomy and kidney stones Denies fevers, chills, n/v, urinary sx A&O x4... NAD

## 2016-06-26 NOTE — Discharge Instructions (Signed)
You have pulled or strained muscle in your abdominal wall. Apply ice or heat, whichever feels better. Take ibuprofen as needed for pain. Avoid movements that trigger the pain. This will likely take about 2 weeks to resolve. If the pain becomes more constant or intense please go to the emergency room.

## 2016-09-07 DIAGNOSIS — N959 Unspecified menopausal and perimenopausal disorder: Secondary | ICD-10-CM | POA: Diagnosis not present

## 2016-09-07 DIAGNOSIS — R87610 Atypical squamous cells of undetermined significance on cytologic smear of cervix (ASC-US): Secondary | ICD-10-CM | POA: Diagnosis not present

## 2016-09-07 DIAGNOSIS — Z1231 Encounter for screening mammogram for malignant neoplasm of breast: Secondary | ICD-10-CM | POA: Diagnosis not present

## 2016-09-07 DIAGNOSIS — R875 Abnormal microbiological findings in specimens from female genital organs: Secondary | ICD-10-CM | POA: Diagnosis not present

## 2016-09-07 DIAGNOSIS — Z124 Encounter for screening for malignant neoplasm of cervix: Secondary | ICD-10-CM | POA: Diagnosis not present

## 2016-09-07 DIAGNOSIS — Z1389 Encounter for screening for other disorder: Secondary | ICD-10-CM | POA: Diagnosis not present

## 2016-09-07 DIAGNOSIS — N950 Postmenopausal bleeding: Secondary | ICD-10-CM | POA: Diagnosis not present

## 2016-09-07 DIAGNOSIS — Z01419 Encounter for gynecological examination (general) (routine) without abnormal findings: Secondary | ICD-10-CM | POA: Diagnosis not present

## 2016-09-07 DIAGNOSIS — Z130 Encounter for screening for diseases of the blood and blood-forming organs and certain disorders involving the immune mechanism: Secondary | ICD-10-CM | POA: Diagnosis not present

## 2016-10-06 DIAGNOSIS — H1013 Acute atopic conjunctivitis, bilateral: Secondary | ICD-10-CM | POA: Diagnosis not present

## 2016-10-06 MED FILL — ZYLET EYE DROPS: 0.5-0.3 | 25 days supply | Qty: 5 | Fill #0

## 2016-10-08 ENCOUNTER — Telehealth: Payer: Self-pay | Admitting: Medical

## 2016-10-08 ENCOUNTER — Ambulatory Visit (INDEPENDENT_AMBULATORY_CARE_PROVIDER_SITE_OTHER): Payer: 59 | Admitting: Medical

## 2016-10-08 ENCOUNTER — Encounter: Payer: Self-pay | Admitting: Medical

## 2016-10-08 ENCOUNTER — Ambulatory Visit: Payer: 59 | Admitting: Medical

## 2016-10-08 VITALS — BP 120/80 | HR 82 | Temp 99.1°F | Ht 62.0 in | Wt 161.6 lb

## 2016-10-08 DIAGNOSIS — R5383 Other fatigue: Secondary | ICD-10-CM

## 2016-10-08 DIAGNOSIS — R635 Abnormal weight gain: Secondary | ICD-10-CM

## 2016-10-08 DIAGNOSIS — E785 Hyperlipidemia, unspecified: Secondary | ICD-10-CM

## 2016-10-08 DIAGNOSIS — R61 Generalized hyperhidrosis: Secondary | ICD-10-CM

## 2016-10-08 NOTE — Progress Notes (Signed)
Subjective:    Patient ID: [name], female    DOB: 1971-06-22, 45 y.o.   MRN: [id number]  HPI   Pt in for report that her allergies have been improved.  Pt also reports recently a lot of fatigue, some excess sweating and some heat intolerance. But pt  Explains maybe not hot flashes. Pt gyn does not think she is peri -menopausal. Part has partial hysterectomy(so does not menstruate). Pt states she had been trying to lose weight but has been had difficult time over past month. She actually gained some weight last month after loosing some.  Pt is only smoking 2 cigarettes a day.   Pt had history of mild high ldl in the past.       Review of Systems  Constitutional: Positive for diaphoresis, fatigue and unexpected weight change. Negative for chills and fever.       Pt lost weight for a while but then gained weight fast in last month.  HENT: Negative for congestion, ear discharge, mouth sores, postnasal drip, sinus pressure and sore throat.   Respiratory: Negative for cough, chest tightness, shortness of breath and wheezing.   Cardiovascular: Negative for chest pain and palpitations.  Gastrointestinal: Negative for abdominal pain and anal bleeding.  Endocrine: Positive for heat intolerance. Negative for polydipsia, polyphagia and polyuria.  Genitourinary: Negative for difficulty urinating, dysuria, flank pain, frequency and menstrual problem.  Musculoskeletal: Negative for back pain.  Neurological: Negative for dizziness, weakness, light-headedness and numbness.  Hematological: Negative for adenopathy. Does not bruise/bleed easily.  Psychiatric/Behavioral: Negative for behavioral problems and confusion.   Past Medical History:  Diagnosis Date  . Chronic female pelvic pain    improved after hysterectomy  . History of nephrolithiasis   . History of syncope    during pregnancy     Social History   Social History  . Marital status: Single    Spouse name: N/A  . Number of  children: N/A  . Years of education: N/A   Occupational History  . Not on file.   Social History Main Topics  . Smoking status: Current Every Day Smoker    Packs/day: 0.50    Types: Cigarettes  . Smokeless tobacco: Never Used     Comment: 5 cigarettes daily  . Alcohol use 0.0 oz/week     Comment: rarely  . Drug use: Unknown  . Sexual activity: Not on file   Other Topics Concern  . 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 APPENDECTOMY    . COLONOSCOPY  2003   Dr. Isa Rankin  . TONSILLECTOMY    . VAGINAL HYSTERECTOMY      Family History  Problem Relation Age of Onset  . Uterine cancer Mother   . Diabetes type II Paternal Grandfather   . Coronary artery disease Paternal Grandfather   . Hyperlipidemia Paternal Grandfather   . Breast cancer Neg Hx   . Prostate cancer Neg Hx   . Colon cancer Neg Hx   . Hypertension      maternal & Paternal grandfather    Allergies  Allergen Reactions  . Morphine And Related     Current Outpatient Prescriptions on File Prior to Visit  Medication Sig Dispense Refill  . azelastine (ASTELIN) 0.1 % nasal spray Place 2 sprays into both nostrils 2 (two) times daily. Use in  each nostril as directed 30 mL 0  . fluticasone (FLONASE) 50 MCG/ACT nasal spray Place 2 sprays into both nostrils daily. 16 g 1  . NEOMYCIN-POLYMYXIN-HYDROCORTISONE (CORTISPORIN) 1 % SOLN otic solution Place 3 drops into the left ear every 6 (six) hours. 10 mL 0  . valACYclovir (VALTREX) 1000 MG tablet TAKE 1 TABLET BY MOUTH 2 TIMES DAILY. (Patient not taking: Reported on 10/08/2016) 14 tablet PRN   No current facility-administered medications on file prior to visit.     BP 120/80   Pulse 82   Temp 99.1 F (37.3 C) (Oral)   Ht 5\' 2"  (1.575 m)   Wt 161 lb 9.6 oz (73.3 kg)   SpO2 100%   BMI 29.56 kg/m       Objective:   Physical  Exam  General Mental Status- Alert. General Appearance- Not in acute distress.   Skin General: Color- Normal Color. Moisture- Normal Moisture.  Neck Carotid Arteries- Normal color. Moisture- Normal Moisture. No carotid bruits. No JVD.no thyromegaly  Chest and Lung Exam Auscultation: Breath Sounds:-Normal.  Cardiovascular Auscultation:Rythm- Regular. Murmurs & Other Heart Sounds:Auscultation of the heart reveals- No Murmurs.  Abdomen Inspection:-Inspeection Normal. Palpation/Percussion:Note:No mass. Palpation and Percussion of the abdomen reveal- Non Tender, Non Distended + BS, no rebound or guarding.    Neurologic Cranial Nerve exam:- CN III-XII intact(No nystagmus), symmetric smile. Strength:- 5/5 equal and symmetric strength both upper and lower extremities.      Assessment & Plan:  For your various symptoms of  fatigue, sweating, heat intolerance and weight gain will get labs tomorrow morning.   Keep eating healthy and exercise. We will let you know your results are in.  Follow up date to be determined after lab review.  Rexford Prevo, [name], PA-C

## 2016-10-08 NOTE — Telephone Encounter (Signed)
No charge. 

## 2016-10-08 NOTE — Telephone Encounter (Signed)
Patient lvm today stating child is sick and cant make her 8am appointment, lvm advising patient to [facility]. Charge or no charge

## 2016-10-08 NOTE — Patient Instructions (Addendum)
For your various symptoms of  fatigue, sweating, heat intolerance and weight gain will get labs tomorrow morning.   Keep eating healthy and exercise. We will let you know your results are in.  Follow up date to be determined after lab review.

## 2016-10-08 NOTE — Progress Notes (Signed)
Pre visit review using our clinic tool,if applicable. No additional management support is needed unless otherwise documented below in the visit note.  

## 2016-10-09 ENCOUNTER — Other Ambulatory Visit (INDEPENDENT_AMBULATORY_CARE_PROVIDER_SITE_OTHER): Payer: 59

## 2016-10-09 DIAGNOSIS — E785 Hyperlipidemia, unspecified: Secondary | ICD-10-CM | POA: Diagnosis not present

## 2016-10-09 DIAGNOSIS — R5383 Other fatigue: Secondary | ICD-10-CM | POA: Diagnosis not present

## 2016-10-09 DIAGNOSIS — R635 Abnormal weight gain: Secondary | ICD-10-CM

## 2016-10-09 DIAGNOSIS — R61 Generalized hyperhidrosis: Secondary | ICD-10-CM | POA: Diagnosis not present

## 2016-10-09 LAB — COMPREHENSIVE METABOLIC PANEL
ALT: 13 U/L (ref 0–35)
AST: 14 U/L (ref 0–37)
Albumin: 4.2 g/dL (ref 3.5–5.2)
Alkaline Phosphatase: 55 U/L (ref 39–117)
BILIRUBIN TOTAL: 0.3 mg/dL (ref 0.2–1.2)
BUN: 10 mg/dL (ref 6–23)
CALCIUM: 9.2 mg/dL (ref 8.4–10.5)
CHLORIDE: 104 meq/L (ref 96–112)
CO2: 29 meq/L (ref 19–32)
Creatinine, Ser: 0.84 mg/dL (ref 0.40–1.20)
GFR: 77.1 mL/min (ref >60.00)
GLUCOSE: 90 mg/dL (ref 70–99)
POTASSIUM: 4.1 meq/L (ref 3.5–5.1)
Sodium: 138 mEq/L (ref 135–145)
Total Protein: 6.8 g/dL (ref 6.0–8.3)

## 2016-10-09 LAB — CBC WITH DIFFERENTIAL/PLATELET
BASOS ABS: 0 10*3/uL (ref 0.0–0.1)
BASOS PCT: 0.4 % (ref 0.0–3.0)
EOS ABS: 0.1 10*3/uL (ref 0.0–0.7)
Eosinophils Relative: 2.2 % (ref 0.0–5.0)
HEMATOCRIT: 39.9 % (ref 36.0–46.0)
Hemoglobin: 13.6 g/dL (ref 12.0–15.0)
LYMPHS ABS: 1.6 10*3/uL (ref 0.7–4.0)
Lymphocytes Relative: 28.3 % (ref 12.0–46.0)
MCHC: 34 g/dL (ref 30.0–36.0)
MCV: 94.1 fl (ref 78.0–100.0)
MONOS PCT: 6.2 % (ref 3.0–12.0)
Monocytes Absolute: 0.3 10*3/uL (ref 0.1–1.0)
NEUTROS ABS: 3.5 10*3/uL (ref 1.4–7.7)
NEUTROS PCT: 62.9 % (ref 43.0–77.0)
PLATELETS: 317 10*3/uL (ref 150.0–400.0)
RBC: 4.24 Mil/uL (ref 3.87–5.11)
RDW: 13.5 % (ref 11.5–15.5)
WBC: 5.6 10*3/uL (ref 4.0–10.5)

## 2016-10-09 LAB — LIPID PANEL
Cholesterol: 136 mg/dL (ref 0–200)
HDL: 45.9 mg/dL
LDL Cholesterol: 78 mg/dL (ref 0–99)
NonHDL: 90.27
Total CHOL/HDL Ratio: 3
Triglycerides: 63 mg/dL (ref 0.0–149.0)
VLDL: 12.6 mg/dL (ref 0.0–40.0)

## 2016-10-09 LAB — TSH: TSH: 0.92 u[IU]/mL (ref 0.35–4.50)

## 2016-10-09 LAB — T4, FREE: Free T4: 0.77 ng/dL (ref 0.60–1.60)

## 2016-10-09 LAB — FOLLICLE STIMULATING HORMONE: FSH: 4 m[IU]/mL

## 2016-10-12 ENCOUNTER — Encounter: Payer: Self-pay | Admitting: Medical

## 2016-11-03 ENCOUNTER — Encounter: Payer: Self-pay | Admitting: Medical

## 2016-11-03 NOTE — Telephone Encounter (Signed)
Please advise in PCP absence.  

## 2016-11-03 NOTE — Telephone Encounter (Signed)
She needs ov 

## 2016-11-12 ENCOUNTER — Encounter: Payer: Self-pay | Admitting: Medical

## 2016-11-12 ENCOUNTER — Ambulatory Visit (INDEPENDENT_AMBULATORY_CARE_PROVIDER_SITE_OTHER): Payer: 59 | Admitting: Medical

## 2016-11-12 VITALS — BP 104/70 | HR 69 | Temp 98.0°F | Ht 62.0 in | Wt 162.0 lb

## 2016-11-12 DIAGNOSIS — R112 Nausea with vomiting, unspecified: Secondary | ICD-10-CM | POA: Diagnosis not present

## 2016-11-12 DIAGNOSIS — R1013 Epigastric pain: Secondary | ICD-10-CM | POA: Diagnosis not present

## 2016-11-12 LAB — COMPREHENSIVE METABOLIC PANEL
ALBUMIN: 4.3 g/dL (ref 3.5–5.2)
ALK PHOS: 59 U/L (ref 39–117)
ALT: 10 U/L (ref 0–35)
AST: 12 U/L (ref 0–37)
BILIRUBIN TOTAL: 0.3 mg/dL (ref 0.2–1.2)
BUN: 11 mg/dL (ref 6–23)
CALCIUM: 9.3 mg/dL (ref 8.4–10.5)
CO2: 30 mEq/L (ref 19–32)
Chloride: 102 mEq/L (ref 96–112)
Creatinine, Ser: 0.77 mg/dL (ref 0.40–1.20)
GFR: 85.21 mL/min (ref >60.00)
Glucose, Bld: 96 mg/dL (ref 70–99)
Potassium: 3.2 mEq/L — ABNORMAL LOW (ref 3.5–5.1)
Sodium: 141 mEq/L (ref 135–145)
TOTAL PROTEIN: 7.1 g/dL (ref 6.0–8.3)

## 2016-11-12 LAB — CBC WITH DIFFERENTIAL/PLATELET
BASOS ABS: 0 10*3/uL (ref 0.0–0.1)
Basophils Relative: 0.5 % (ref 0.0–3.0)
Eosinophils Absolute: 0.1 10*3/uL (ref 0.0–0.7)
Eosinophils Relative: 1.3 % (ref 0.0–5.0)
HEMATOCRIT: 41 % (ref 36.0–46.0)
HEMOGLOBIN: 13.9 g/dL (ref 12.0–15.0)
LYMPHS PCT: 31.5 % (ref 12.0–46.0)
Lymphs Abs: 2.1 10*3/uL (ref 0.7–4.0)
MCHC: 33.9 g/dL (ref 30.0–36.0)
MCV: 93.4 fl (ref 78.0–100.0)
MONOS PCT: 5.2 % (ref 3.0–12.0)
Monocytes Absolute: 0.3 10*3/uL (ref 0.1–1.0)
NEUTROS ABS: 4 10*3/uL (ref 1.4–7.7)
Neutrophils Relative %: 61.5 % (ref 43.0–77.0)
PLATELETS: 330 10*3/uL (ref 150.0–400.0)
RBC: 4.4 Mil/uL (ref 3.87–5.11)
RDW: 12.6 % (ref 11.5–15.5)
WBC: 6.5 10*3/uL (ref 4.0–10.5)

## 2016-11-12 LAB — LIPASE: LIPASE: 37 U/L (ref 11.0–59.0)

## 2016-11-12 LAB — AMYLASE: Amylase: 46 U/L (ref 27–131)

## 2016-11-12 MED ORDER — RANITIDINE HCL 150 MG PO CAPS: 150.0000 mg | ORAL_CAPSULE | Freq: Two times a day (BID) | ORAL | 0 refills | Status: DC

## 2016-11-12 MED ORDER — ONDANSETRON 8 MG PO TBDP: 8.0000 mg | ORAL_TABLET | Freq: Three times a day (TID) | ORAL | 0 refills | Status: DC | PRN

## 2016-11-12 MED FILL — ONDANSETRON ODT 8 MG TABLET: 8 | 5 days supply | Qty: 15 | Fill #0

## 2016-11-12 NOTE — Progress Notes (Signed)
Pre visit review using our clinic review tool, if applicable. No additional management support is needed unless otherwise documented below in the visit note. 

## 2016-11-12 NOTE — Patient Instructions (Addendum)
For your abdomen pain, I want you to eat healthy and take ranitidine 150 mg twice daily.  May give ppi if not responding to twice daily ranitidine.  Please get stat labs and will order abdomen US.  May refer you to GI in near future.  For nausea rx zofran.  Follow up in 5 days or as needed

## 2016-11-12 NOTE — Progress Notes (Signed)
Subjective:    Patient ID: [name], female    DOB: 1971-12-03, 45 y.o.   MRN: [id number]  HPI  Pt in for evaluation.  Pt had some recent symptoms of abdomen pain  After eating and drinking some alcohol(she has stopped drinking her margarita since onset of pain). But still has pain in abdomen. She states every morning after drinking coffee will vomit one time. Rest of the day after eating feels nausea after eating. Zantac will help some but not completely. She takes one tab a day. Some times patient will eat and will get ruq pain. Pt states a lot of her family have GB disease. She belches a lot.   Pain as describes above started about 2 weeks ago.  Pt states no blood in stool. No bright red blood or black stools.  Pt had hysterectomy. Pt has no back pain presently.  Pt ate 2 hours ago.   Review of Systems  Constitutional: Negative for chills, fatigue and fever.  Respiratory: Negative for cough, choking, shortness of breath and wheezing.   Cardiovascular: Negative for chest pain and palpitations.  Gastrointestinal: Positive for abdominal pain, nausea and vomiting. Negative for abdominal distention, blood in stool, constipation, diarrhea and rectal pain.       See hpi.  Genitourinary: Negative for dysuria and flank pain.  Musculoskeletal: Negative for back pain.  Skin: Negative for rash.  Neurological: Negative for dizziness and light-headedness.  Hematological: Negative for adenopathy. Does not bruise/bleed easily.  Psychiatric/Behavioral: Negative for behavioral problems and confusion.     Past Medical History:  Diagnosis Date  . Chronic female pelvic pain    improved after hysterectomy  . History of nephrolithiasis   . History of syncope    during pregnancy     Social History   Social History  . Marital status: Single    Spouse name: N/A  . Number of children: N/A  . Years of education: N/A   Occupational History  . Not on file.   Social History Main  Topics  . Smoking status: Current Every Day Smoker    Packs/day: 0.50    Types: Cigarettes  . Smokeless tobacco: Never Used     Comment: 5 cigarettes daily  . Alcohol use 0.0 oz/week     Comment: rarely  . Drug use: Unknown  . Sexual activity: Not on file   Other Topics Concern  . Not on file   Social History Narrative   Education officer, environmental for [employer]   Married 6 years   Children-teenagers    Past Surgical History:  Procedure Laterality Date  . APPENDECTOMY    . COLONOSCOPY  2003   Dr. Isa Rankin  . TONSILLECTOMY    . VAGINAL HYSTERECTOMY      Family History  Problem Relation Age of Onset  . Uterine cancer Mother   . Diabetes type II Paternal Grandfather   . Coronary artery disease Paternal Grandfather   . Hyperlipidemia Paternal Grandfather   . Breast cancer Neg Hx   . Prostate cancer Neg Hx   . Colon cancer Neg Hx   . Hypertension      maternal & Paternal grandfather    Allergies  Allergen Reactions  . Morphine And Related     No current outpatient prescriptions on file prior to visit.   No current facility-administered medications on file prior to visit.     BP 104/70 (BP Location: Left Arm, Patient Position: Sitting)   Pulse 69   Temp  98 F (36.7 C) (Oral)   Ht 5\' 2"  (1.575 m)   Wt 162 lb (73.5 kg)   SpO2 98%   BMI 29.63 kg/m      Objective:   Physical Exam  General Appearance- Not in acute distress.  HEENT Eyes- Scleraeral/Conjuntiva-bilat- Not Yellow. Mouth & Throat- Normal.  Chest and Lung Exam Auscultation: Breath sounds:-Normal. Adventitious sounds:- No Adventitious sounds.  Cardiovascular Auscultation:Rythm - Regular. Heart Sounds -Normal heart sounds.  Abdomen Inspection:-Inspection Normal.  Palpation/Perucssion: Palpation and Percussion of the abdomen reveal- faint epigastric and ruq Tender, No Rebound tenderness, No rigidity(Guarding) and No Palpable abdominal masses.  Liver:-Normal.  Spleen:- Normal.   Back- no cva  tenderness.      Assessment & Plan:  For your abdomen pain, I want you to eat healthy and take ranitidine 150 mg twice daily.  May give ppi if not responding to twice daily ranitidine.  Please get stat labs and will order abdomen US.  May refer you to GI in near future.  For nausea rx zofran.  Follow up in 5 days or as needed

## 2016-11-13 ENCOUNTER — Ambulatory Visit (HOSPITAL_COMMUNITY)
Admission: RE | Admit: 2016-11-13 | Discharge: 2016-11-13 | Disposition: A | Discharge status: Home / Self Care | Payer: 59 | Source: Ambulatory Visit | Attending: Medical | Admitting: Medical

## 2016-11-13 DIAGNOSIS — R112 Nausea with vomiting, unspecified: Secondary | ICD-10-CM | POA: Diagnosis not present

## 2016-11-13 DIAGNOSIS — D1771 Benign lipomatous neoplasm of kidney: Secondary | ICD-10-CM | POA: Insufficient documentation

## 2016-11-13 DIAGNOSIS — R1013 Epigastric pain: Secondary | ICD-10-CM

## 2016-11-13 DIAGNOSIS — N281 Cyst of kidney, acquired: Secondary | ICD-10-CM | POA: Insufficient documentation

## 2016-11-13 DIAGNOSIS — D3002 Benign neoplasm of left kidney: Secondary | ICD-10-CM | POA: Diagnosis not present

## 2016-11-13 LAB — H. PYLORI BREATH TEST: H. PYLORI BREATH TEST: NOT DETECTED

## 2016-11-13 MED ORDER — POTASSIUM CHLORIDE ER 10 MEQ PO TBCR: 10.0000 meq | EXTENDED_RELEASE_TABLET | Freq: Every day | ORAL | 0 refills | Status: DC

## 2016-11-13 MED FILL — KLOR-CON M10 TABLET: 10 | 7 days supply | Qty: 7 | Fill #0

## 2016-11-13 NOTE — Progress Notes (Signed)
Pt has seen results on MyChart and message also sent for patient to call back if any questions.

## 2016-11-13 NOTE — Telephone Encounter (Signed)
Sent in potassium low dose.

## 2016-11-15 NOTE — Telephone Encounter (Signed)
Will you refer to GI asap. Pt works at [employer]. She has a lot of pain after eating with vomiting. Work up so far negative. Will you see GI referral and get in quickly if possible.

## 2016-11-18 ENCOUNTER — Encounter: Payer: Self-pay | Admitting: Internal Medicine

## 2016-11-18 ENCOUNTER — Telehealth: Payer: Self-pay

## 2016-11-18 ENCOUNTER — Ambulatory Visit (INDEPENDENT_AMBULATORY_CARE_PROVIDER_SITE_OTHER): Payer: 59 | Admitting: Internal Medicine

## 2016-11-18 VITALS — BP 116/66 | HR 64 | Ht 61.0 in | Wt 163.0 lb

## 2016-11-18 DIAGNOSIS — R112 Nausea with vomiting, unspecified: Secondary | ICD-10-CM | POA: Diagnosis not present

## 2016-11-18 DIAGNOSIS — R140 Abdominal distension (gaseous): Secondary | ICD-10-CM

## 2016-11-18 MED ORDER — OMEPRAZOLE 40 MG PO CPDR: 40.0000 mg | DELAYED_RELEASE_CAPSULE | Freq: Every day | ORAL | 6 refills | Status: DC

## 2016-11-18 MED FILL — OMEPRAZOLE DR 40 MG CAPSULE: 40 | 30 days supply | Qty: 30 | Fill #0

## 2016-11-18 NOTE — Patient Instructions (Signed)
We have sent the following medications to your pharmacy for you to pick up at your convenience:  Omeprazole  You have been scheduled for an endoscopy. Please follow written instructions given to you at your visit today. If you use inhalers (even only as needed), please bring them with you on the day of your procedure.   I will call you back with the scheduling information for your Hida scan

## 2016-11-18 NOTE — Progress Notes (Signed)
HISTORY OF PRESENT ILLNESS:  [name] is a 45 y.o. female , [employer] employee working as an Scientist, physiological in physical therapy, who is referred by her primary provider Mr. Harvie Heck with chief complaint of recurrent nausea and vomiting. Patient reports she was in her usual state of health until approximately 3 months ago when she awoke one morning after having had [place] and margaritas evening prior with abdominal burning discomfort followed by nausea, vomiting, and diarrhea. Things settled down for a while though she continued with some variable degrees of asked her distress including nausea and bloating. She transiently quit smoking and using alcohol. However she has resumed these activities. She reports 10 pound weight gain over the past year. Over the past couple weeks she's had problems with nausea and vomiting in the morning. General an hour after awaking and having had coffee. No food or blood in the emesis. She does have some vague epigastric discomfort. She was placed on ranitidine about one month ago. Taking just one daily. She feels this has helped some. Recently prescribed Zofran at night. Recent blood work unremarkable except for low potassium for which she is now on complicated or. Bowel habits are generally normal. Patient did have problems with right-sided abdominal pain remotely. For this she underwent complete colonoscopy with Dr. Collene Mares in 2003. This was unremarkable including ileal intubation. She also underwent negative laparoscopy with Dr. Alden Hipp in 2001. She was seen in the emergency room back in late June 2017 with right lower quadrant pain or recent Helicobacter breath testing was negative. Abdominal ultrasound last week was unremarkable. Normal gallbladder. Patient does have a history of migraine headaches but these have not been problematic in over a year. She denies cannabis use  REVIEW OF SYSTEMS:  All non-GI ROS negative except for sinus and allergy, fatigue, night sweats,  excessive urination pleasant,  Past Medical History:  Diagnosis Date  . Chronic female pelvic pain    improved after hysterectomy  . History of nephrolithiasis   . History of syncope    during pregnancy    Past Surgical History:  Procedure Laterality Date  . APPENDECTOMY    . COLONOSCOPY  2003   Dr. Isa Rankin  . TONSILLECTOMY    . VAGINAL HYSTERECTOMY      Social History [name]  reports that she has been smoking Cigarettes.  She has been smoking about 0.50 packs per day. She has never used smokeless tobacco. She reports that she drinks alcohol. Her drug history is not on file.  family history includes Coronary artery disease in her paternal grandfather; Diabetes type II in her paternal grandfather; Hyperlipidemia in her paternal grandfather; Uterine cancer in her mother.  Allergies  Allergen Reactions  . Morphine And Related        PHYSICAL EXAMINATION: Vital signs: BP 116/66   Pulse 64   Ht 5\' 1"  (1.549 m)   Wt 163 lb (73.9 kg)   BMI 30.80 kg/m   Constitutional: generally well-appearing, no acute distress Psychiatric: alert and oriented x3, cooperative Eyes: extraocular movements intact, anicteric, conjunctiva pink Mouth: oral pharynx moist, no lesions Neck: supple no lymphadenopathy Cardiovascular: heart regular rate and rhythm, no murmur Lungs: clear to auscultation bilaterally Abdomen: soft, nontender, nondistended, no obvious ascites, no peritoneal signs, normal bowel sounds, no organomegaly Rectal:Omitted Extremities: no clubbing cyanosis or lower extremity edema bilaterally Skin: no lesions on visible extremities Neuro: No focal deficits. Cranial nerves intact  ASSESSMENT:  #1. Recent problems with intermittent nausea and vomiting as  described. Possible etiologies include reflux disease, ulcer disease, gastroparesis, gallbladder dysfunction.  PLAN:  #1. Prescribe omeprazole 40 mg daily #2. Reflux precautions with attention to weight loss #3.  Schedule upper endoscopy to evaluate ongoing upper GI symptoms.The nature of the procedure, as well as the risks, benefits, and alternatives were carefully and thoroughly reviewed with the patient. Ample time for discussion and questions allowed. The patient understood, was satisfied, and agreed to proceed. #4. Nuclear medicine HIDA scan to rule out gallbladder dysfunction  A copy of this consultation note has been sent to [employer], PA-C

## 2016-11-18 NOTE — Telephone Encounter (Signed)
Left message on voice mail that HIDA scan was scheduled for 11/27/2016 at 9:00am, arrive at 8:45am.  Nothing to eat or drink after midnight; this is a 2 hour test.

## 2016-11-23 ENCOUNTER — Telehealth: Payer: Self-pay | Admitting: Internal Medicine

## 2016-11-23 NOTE — Telephone Encounter (Signed)
Patient wants to know if she can take zantac with the Prilosec.  She is advised that she can continue if needed.  She will keep her upcoming appt for EGD and HIDA scan

## 2016-11-23 NOTE — Telephone Encounter (Signed)
Pt states the abdominal pain is very painful. Pt is wanting to know if she can take Zantac or anything else states Prilosec is not helping at all best contact number [phone].

## 2016-11-24 ENCOUNTER — Other Ambulatory Visit (INDEPENDENT_AMBULATORY_CARE_PROVIDER_SITE_OTHER): Payer: 59

## 2016-11-24 DIAGNOSIS — R1013 Epigastric pain: Secondary | ICD-10-CM | POA: Diagnosis not present

## 2016-11-24 DIAGNOSIS — R112 Nausea with vomiting, unspecified: Secondary | ICD-10-CM

## 2016-11-24 NOTE — Addendum Note (Signed)
Addended by: Tasia Catchings on: 11/24/2016 01:00 PM   Modules accepted: Orders

## 2016-11-25 ENCOUNTER — Ambulatory Visit (HOSPITAL_COMMUNITY)
Admission: RE | Admit: 2016-11-25 | Discharge: 2016-11-25 | Disposition: A | Discharge status: Home / Self Care | Payer: 59 | Source: Ambulatory Visit | Attending: Internal Medicine | Admitting: Internal Medicine

## 2016-11-25 ENCOUNTER — Telehealth: Payer: Self-pay | Admitting: Internal Medicine

## 2016-11-25 DIAGNOSIS — R112 Nausea with vomiting, unspecified: Secondary | ICD-10-CM | POA: Insufficient documentation

## 2016-11-25 DIAGNOSIS — R140 Abdominal distension (gaseous): Secondary | ICD-10-CM | POA: Insufficient documentation

## 2016-11-25 LAB — FECAL OCCULT BLOOD, IMMUNOCHEMICAL: FECAL OCCULT BLD: NEGATIVE

## 2016-11-25 MED ORDER — TECHNETIUM TC 99M MEBROFENIN IV KIT
5.0000 | PACK | Freq: Once | INTRAVENOUS | Status: AC | PRN
  Administered 2016-11-25: 5 via INTRAVENOUS

## 2016-11-25 NOTE — Telephone Encounter (Signed)
Irene Shipper, MD  Algernon Huxley, RN        Let patient know that HIDA scan is normal. Keep other plans as previously outlined     Left message on machine to call back

## 2016-11-25 NOTE — Telephone Encounter (Signed)
Pt notified and will keep EGD as scheduled for 12/03/16

## 2016-11-26 NOTE — Progress Notes (Signed)
Pt has seen results on MyChart and message also sent for patient to call back if any questions.

## 2016-11-27 ENCOUNTER — Encounter (HOSPITAL_COMMUNITY): Payer: 59

## 2016-12-03 ENCOUNTER — Encounter: Payer: Self-pay | Admitting: Internal Medicine

## 2016-12-03 ENCOUNTER — Ambulatory Visit (AMBULATORY_SURGERY_CENTER): Payer: 59 | Admitting: Internal Medicine

## 2016-12-03 VITALS — BP 101/60 | HR 61 | Temp 99.3°F | Resp 16 | Wt 163.0 lb

## 2016-12-03 DIAGNOSIS — R197 Diarrhea, unspecified: Secondary | ICD-10-CM | POA: Diagnosis not present

## 2016-12-03 DIAGNOSIS — R112 Nausea with vomiting, unspecified: Secondary | ICD-10-CM

## 2016-12-03 DIAGNOSIS — K219 Gastro-esophageal reflux disease without esophagitis: Secondary | ICD-10-CM | POA: Diagnosis not present

## 2016-12-03 MED ORDER — SODIUM CHLORIDE 0.9 % IV SOLN: 500.0000 mL | INTRAVENOUS | Status: DC

## 2016-12-03 NOTE — Op Note (Signed)
[facility] Patient Name: [name] Procedure Date: 12/03/2016 2:26 PM MRN: [id number] Endoscopist: Docia Chuck. Henrene Pastor , MD Age: 45 Referring MD:  Date of Birth: [birth year] Gender: Female Account #: [id number] Procedure:                Upper GI endoscopy Indications:              Nausea with vomiting Medicines:                Monitored Anesthesia Care Procedure:                Pre-Anesthesia Assessment:                           - Prior to the procedure, a History and Physical                            was performed, and patient medications and                            allergies were reviewed. The patient's tolerance of                            previous anesthesia was also reviewed. The risks                            and benefits of the procedure and the sedation                            options and risks were discussed with the patient.                            All questions were answered, and informed consent                            was obtained. Prior Anticoagulants: The patient has                            taken no previous anticoagulant or antiplatelet                            agents. ASA Grade Assessment: I - A normal, healthy                            patient. After reviewing the risks and benefits,                            the patient was deemed in satisfactory condition to                            undergo the procedure.                           After obtaining informed consent, the endoscope was  passed under direct vision. Throughout the                            procedure, the patient's blood pressure, pulse, and                            oxygen saturations were monitored continuously. The                            Model GIF-HQ190 [id number]) scope was introduced                            through the mouth, and advanced to the second part                            of duodenum. The upper GI endoscopy was                    accomplished without difficulty. The patient                            tolerated the procedure well. Scope In: Scope Out: Findings:                 The esophagus was normal.                           The stomach was normal.                           The examined duodenum was normal.                           The cardia and gastric fundus were normal on                            retroflexion. Complications:            No immediate complications. Estimated Blood Loss:     Estimated blood loss: none. Impression:               - Normal EGD.                           - Nausea and vomiting of undetermined cause. Recommendation:           - Schedule Solid Phase Gastric Emptying Scan (rule                            out gastroparesis). We will call you with results                            and additional recommendations (if needed)                           - Resume previous diet.                           - Continue  present medications, including                            omeprazole, Zantac, and Zofran. Docia Chuck. Henrene Pastor, MD 12/03/2016 2:48:39 PM This report has been signed electronically.

## 2016-12-03 NOTE — Progress Notes (Signed)
Report to PACU, RN, vss, BBS= Clear.  

## 2016-12-03 NOTE — Patient Instructions (Signed)
Impression/Recommendations:  Continue present medications including omeprazole, Zantac, and Zofran.  Schedule Solid Phase Gastric Emptying Scan.  YOU HAD AN ENDOSCOPIC PROCEDURE TODAY AT [facility]:   Refer to the procedure report that was given to you for any specific questions about what was found during the examination.  If the procedure report does not answer your questions, please call your gastroenterologist to clarify.  If you requested that your care partner not be given the details of your procedure findings, then the procedure report has been included in a sealed envelope for you to review at your convenience later.  YOU SHOULD EXPECT: Some feelings of bloating in the abdomen. Passage of more gas than usual.  Walking can help get rid of the air that was put into your GI tract during the procedure and reduce the bloating. If you had a lower endoscopy (such as a colonoscopy or flexible sigmoidoscopy) you may notice spotting of blood in your stool or on the toilet paper. If you underwent a bowel prep for your procedure, you may not have a normal bowel movement for a few days.  Please Note:  You might notice some irritation and congestion in your nose or some drainage.  This is from the oxygen used during your procedure.  There is no need for concern and it should clear up in a day or so.  SYMPTOMS TO REPORT IMMEDIATELY:  Following upper endoscopy (EGD)  Vomiting of blood or coffee ground material  New chest pain or pain under the shoulder blades  Painful or persistently difficult swallowing  New shortness of breath  Fever of 100F or higher  Black, tarry-looking stools  For urgent or emergent issues, a gastroenterologist can be reached at any hour by calling [phone].   DIET:  We do recommend a small meal at first, but then you may proceed to your regular diet.  Drink plenty of fluids but you should avoid alcoholic beverages for 24 hours.  ACTIVITY:  You  should plan to take it easy for the rest of today and you should NOT DRIVE or use heavy machinery until tomorrow (because of the sedation medicines used during the test).    FOLLOW UP: Our staff will call the number listed on your records the next business day following your procedure to check on you and address any questions or concerns that you may have regarding the information given to you following your procedure. If we do not reach you, we will leave a message.  However, if you are feeling well and you are not experiencing any problems, there is no need to return our call.  We will assume that you have returned to your regular daily activities without incident.  If any biopsies were taken you will be contacted by phone or by letter within the next 1-3 weeks.  Please call us at [phone] if you have not heard about the biopsies in 3 weeks.    SIGNATURES/CONFIDENTIALITY: You and/or your care partner have signed paperwork which will be entered into your electronic medical record.  These signatures attest to the fact that that the information above on your After Visit Summary has been reviewed and is understood.  Full responsibility of the confidentiality of this discharge information lies with you and/or your care-partner.

## 2016-12-04 ENCOUNTER — Telehealth: Payer: Self-pay | Admitting: *Deleted

## 2016-12-04 ENCOUNTER — Other Ambulatory Visit: Payer: Self-pay

## 2016-12-04 DIAGNOSIS — R1084 Generalized abdominal pain: Secondary | ICD-10-CM

## 2016-12-04 NOTE — Telephone Encounter (Signed)
  Follow up Call-  Call back number 12/03/2016  Post procedure Call Back phone  # [phone]  Permission to leave phone message Yes  Some recent data might be hidden     Patient questions:  Do you have a fever, pain , or abdominal swelling? No. Pain Score  0 *  Have you tolerated food without any problems? Yes.    Have you been able to return to your normal activities? Yes.    Do you have any questions about your discharge instructions: Diet   No. Medications  No. Follow up visit  No.  Do you have questions or concerns about your Care? No.  Actions: * If pain score is 4 or above: No action needed, pain <4.

## 2016-12-07 ENCOUNTER — Other Ambulatory Visit: Payer: Self-pay

## 2016-12-07 ENCOUNTER — Telehealth: Payer: Self-pay

## 2016-12-07 NOTE — Telephone Encounter (Signed)
Pt scheduled for ges at [facility] 12/15/16@7am , pt to arrive there at 6:45am. Pt to be NPO after midnight and hold stomach meds for 8 hours prior to scan. Left message for pt to call back.

## 2016-12-07 NOTE — Telephone Encounter (Signed)
Pt called and states she does not want to go through a lot of tests, states she will live with it for now. Requests that GES be cancelled. Dr. Henrene Pastor notified and GES cancelled.

## 2016-12-11 ENCOUNTER — Other Ambulatory Visit: Payer: Self-pay

## 2016-12-11 ENCOUNTER — Telehealth: Payer: Self-pay | Admitting: Internal Medicine

## 2016-12-11 DIAGNOSIS — R109 Unspecified abdominal pain: Secondary | ICD-10-CM

## 2016-12-11 NOTE — Telephone Encounter (Signed)
Pt scheduled for GES at [facility] 12/22/16@9 :30am, pt to arrive there at 9:15am. Pt to be NPO after midnight, hold stomach meds for 8 hours prior to test. Pt aware.

## 2016-12-14 ENCOUNTER — Ambulatory Visit: Payer: 59 | Admitting: Physician Assistant

## 2016-12-15 ENCOUNTER — Ambulatory Visit (HOSPITAL_COMMUNITY): Payer: 59

## 2016-12-22 ENCOUNTER — Ambulatory Visit (HOSPITAL_COMMUNITY): Payer: 59

## 2016-12-23 ENCOUNTER — Encounter: Payer: Self-pay | Admitting: Medical

## 2016-12-23 ENCOUNTER — Ambulatory Visit (INDEPENDENT_AMBULATORY_CARE_PROVIDER_SITE_OTHER): Payer: 59 | Admitting: Medical

## 2016-12-23 VITALS — BP 129/74 | HR 69 | Temp 97.6°F | Wt 162.6 lb

## 2016-12-23 DIAGNOSIS — H109 Unspecified conjunctivitis: Secondary | ICD-10-CM

## 2016-12-23 DIAGNOSIS — R109 Unspecified abdominal pain: Secondary | ICD-10-CM | POA: Diagnosis not present

## 2016-12-23 LAB — POC URINALSYSI DIPSTICK (AUTOMATED)
BILIRUBIN UA: NEGATIVE
GLUCOSE UA: NEGATIVE
KETONES UA: NEGATIVE
LEUKOCYTES UA: NEGATIVE
NITRITE UA: NEGATIVE
Protein, UA: NEGATIVE
RBC UA: NEGATIVE
Spec Grav, UA: 1.02
Urobilinogen, UA: NEGATIVE
pH, UA: 6

## 2016-12-23 LAB — COMPREHENSIVE METABOLIC PANEL
ALT: 13 U/L (ref 0–35)
AST: 13 U/L (ref 0–37)
Albumin: 4.1 g/dL (ref 3.5–5.2)
Alkaline Phosphatase: 67 U/L (ref 39–117)
BILIRUBIN TOTAL: 0.4 mg/dL (ref 0.2–1.2)
BUN: 10 mg/dL (ref 6–23)
CO2: 30 meq/L (ref 19–32)
Calcium: 9.2 mg/dL (ref 8.4–10.5)
Chloride: 103 mEq/L (ref 96–112)
Creatinine, Ser: 0.75 mg/dL (ref 0.40–1.20)
GFR: 87.79 mL/min (ref >60.00)
GLUCOSE: 93 mg/dL (ref 70–99)
Potassium: 3.8 mEq/L (ref 3.5–5.1)
SODIUM: 140 meq/L (ref 135–145)
Total Protein: 6.9 g/dL (ref 6.0–8.3)

## 2016-12-23 MED ORDER — TOBRAMYCIN 0.3 % OP SOLN: OPHTHALMIC | 0 refills | Status: DC

## 2016-12-23 MED FILL — TOBRAMYCIN 0.3% EYE DROPS: 0.3 | 7 days supply | Qty: 5 | Fill #0

## 2016-12-23 NOTE — Progress Notes (Signed)
Pre visit review using our clinic review tool, if applicable. No additional management support is needed unless otherwise documented below in the visit note. 

## 2016-12-23 NOTE — Patient Instructions (Addendum)
For your conjunctivitis will rx tobrex eye solution.  Follow up with your optometrist. They may rx med such as restasis again for your dry eyes.  For your abdomen pain will follow results of your gastric emptying study. Continue your omeprazole and zantac. Will get UA today as well.  Recheck potasium today.  Follow up in 10 days or as needed

## 2016-12-23 NOTE — Progress Notes (Signed)
Subjective:    Patient ID: [name], female    DOB: Nov 10, 1971, 45 y.o.   MRN: [id number]  HPI  Pt in for follow up.  Pt has recent eye redness. Pt states she has been told dry eyes and she had some infection per pt about 4 times in last year. She has seen optometrist  and last rx she got was vigamox. Recent eye redness last 5 days now. Pt has not been using her contacts.  Pt also told to use contact lens drops to moisturize her eyes.    Pt states she has had work up with GI. Pt is scheduled to have Gastric emptying test scheduled tomorrow. Pt is on prilosec and zantac. Pt had  Fairly extensive work up. Pt states since getting back on zantac. It is helping control her pain. Gi has considered reflux, ulcer, gastroparesis and GB dysfunction.   Pt had low k in past. But in past at that time she was vomiting a lot.     Review of Systems  Constitutional: Negative for chills, fatigue and fever.  Eyes:       Conjunctivitis  Respiratory: Negative for cough, chest tightness, shortness of breath and wheezing.   Cardiovascular: Negative for chest pain and palpitations.  Gastrointestinal: Positive for abdominal pain. Negative for abdominal distention.  Genitourinary: Negative for decreased urine volume, difficulty urinating, dysuria, hematuria and vaginal pain.  Musculoskeletal: Negative for back pain.  Skin: Negative for rash.  Neurological: Negative for dizziness and headaches.  Hematological: Negative for adenopathy. Does not bruise/bleed easily.  Psychiatric/Behavioral: Negative for behavioral problems and confusion.    Past Medical History:  Diagnosis Date  . Allergy   . Chronic female pelvic pain    improved after hysterectomy  . Chronic kidney disease   . History of nephrolithiasis   . History of syncope    during pregnancy     Social History   Social History  . Marital status: Married    Spouse name: N/A  . Number of children: N/A  . Years of education: N/A    Occupational History  . Not on file.   Social History Main Topics  . Smoking status: Current Every Day Smoker    Packs/day: 0.50    Types: Cigarettes  . Smokeless tobacco: Never Used     Comment: 5 cigarettes daily  . Alcohol use 0.0 oz/week     Comment: rarely  . Drug use: No  . Sexual activity: Not on file   Other Topics Concern  . Not on file   Social History Narrative   Education officer, environmental for [employer]   Married 6 years   Children-teenagers    Past Surgical History:  Procedure Laterality Date  . APPENDECTOMY    . COLONOSCOPY  2003   Dr. Isa Rankin  . TONSILLECTOMY    . VAGINAL HYSTERECTOMY      Family History  Problem Relation Age of Onset  . Uterine cancer Mother   . Colon polyps Father   . Diabetes type II Paternal Grandfather   . Coronary artery disease Paternal Grandfather   . Hyperlipidemia Paternal Grandfather   . Hypertension      maternal & Paternal grandfather  . Breast cancer Neg Hx   . Prostate cancer Neg Hx   . Colon cancer Neg Hx   . Rectal cancer Neg Hx     Allergies  Allergen Reactions  . Morphine And Related     Current Outpatient Prescriptions on File Prior  to Visit  Medication Sig Dispense Refill  . cetirizine (ZYRTEC) 10 MG tablet Take 10 mg by mouth daily.    Marland Kitchen omeprazole (PRILOSEC) 40 MG capsule Take 1 capsule (40 mg total) by mouth daily. 30 capsule 6  . ondansetron (ZOFRAN ODT) 8 MG disintegrating tablet Take 1 tablet (8 mg total) by mouth every 8 (eight) hours as needed for nausea or vomiting. 15 tablet 0  . potassium chloride (K-DUR) 10 MEQ tablet Take 1 tablet (10 mEq total) by mouth daily. 7 tablet 0  . ranitidine (ZANTAC) 150 MG capsule Take 1 capsule (150 mg total) by mouth 2 (two) times daily. 60 capsule 0   Current Facility-Administered Medications on File Prior to Visit  Medication Dose Route Frequency Provider Last Rate Last Dose  . 0.9 %  sodium chloride infusion  500 mL Intravenous Continuous Irene Shipper, MD         BP 129/74 (BP Location: Left Arm, Patient Position: Sitting, Cuff Size: Normal)   Pulse 69   Temp 97.6 F (36.4 C) (Oral)   Wt 162 lb 9.6 oz (73.8 kg)   SpO2 100% Comment: RA  BMI 30.72 kg/m       Objective:   Physical Exam  General Appearance- Not in acute distress.  HEENT Eyes- Scleraeral/Conjuntiva-bilat- injected conjunctiva/red. Lt eye worse than  Rt side. No dc presently.  Mouth & Throat- Normal.  Chest and Lung Exam Auscultation: Breath sounds:-Normal. Adventitious sounds:- No Adventitious sounds.  Cardiovascular Auscultation:Rythm - Regular. Heart Sounds -Normal heart sounds.  Abdomen Inspection:-Inspection Normal.  Palpation/Perucssion: Palpation and Percussion of the abdomen reveal- Non Tender, No Rebound tenderness, No rigidity(Guarding) and No Palpable abdominal masses.  Liver:-Normal.  Spleen:- Normal.   Back-no cva tenderness.        Assessment & Plan:  For your conjunctivitis will rx tobrex eye solution.  Follow up with your optometrist. They may rx med such as restasis again for your dry eyes.  For your abdomen pain will follow results of your gastric emptying study. Continue your omeprazole and zantac. Will get UA today as well.  Recheck potassium.  Follow up in 10 days or as needed  [name], [name], [name]

## 2016-12-24 ENCOUNTER — Ambulatory Visit (HOSPITAL_COMMUNITY)
Admission: RE | Admit: 2016-12-24 | Discharge: 2016-12-24 | Disposition: A | Discharge status: Home / Self Care | Payer: 59 | Source: Ambulatory Visit | Attending: Internal Medicine | Admitting: Internal Medicine

## 2016-12-24 DIAGNOSIS — R109 Unspecified abdominal pain: Secondary | ICD-10-CM | POA: Diagnosis not present

## 2016-12-24 DIAGNOSIS — R1011 Right upper quadrant pain: Secondary | ICD-10-CM | POA: Diagnosis not present

## 2016-12-24 MED ORDER — TECHNETIUM TC 99M SULFUR COLLOID
1.9800 | Freq: Once | INTRAVENOUS | Status: AC | PRN
  Administered 2016-12-24: 1.98 via ORAL

## 2016-12-25 ENCOUNTER — Telehealth: Payer: Self-pay | Admitting: Internal Medicine

## 2016-12-25 ENCOUNTER — Encounter: Payer: Self-pay | Admitting: Medical

## 2016-12-25 MED FILL — ZYLET EYE DROPS: 0.5-0.3 | 25 days supply | Qty: 5 | Fill #0

## 2016-12-25 NOTE — Telephone Encounter (Signed)
Left a message for patient to call back. 

## 2016-12-29 NOTE — Telephone Encounter (Signed)
Fax was received dated [12/25/16] RE: Rx for new eye medication [Zylet] stating that Tobramycin did not clear up eye infection; has this been addressed; Please Advise/SLS 01/02

## 2016-12-30 LAB — HM PAP SMEAR

## 2016-12-30 MED ORDER — LOTEPREDNOL-TOBRAMYCIN 0.5-0.3 % OP SUSP: 1.0000 [drp] | Freq: Four times a day (QID) | OPHTHALMIC | 0 refills | Status: DC

## 2016-12-30 NOTE — Telephone Encounter (Signed)
rx zylet sent to pharmacy.

## 2016-12-31 DIAGNOSIS — R87629 Unspecified abnormal cytological findings in specimens from vagina: Secondary | ICD-10-CM | POA: Diagnosis not present

## 2017-01-06 ENCOUNTER — Encounter: Payer: Self-pay | Admitting: Medical

## 2017-01-06 NOTE — Progress Notes (Signed)
Abnormal Pap smear results abstracted/SLS 01/10

## 2017-01-15 DIAGNOSIS — H524 Presbyopia: Secondary | ICD-10-CM | POA: Diagnosis not present

## 2017-01-15 MED FILL — FLUOROMETHOLONE 0.1% DROPS: 0.1 | 20 days supply | Qty: 5 | Fill #0

## 2017-01-20 ENCOUNTER — Telehealth: Payer: Self-pay | Admitting: Internal Medicine

## 2017-01-20 MED FILL — OMEPRAZOLE DR 40 MG CAPSULE: 40 | 30 days supply | Qty: 30 | Fill #1

## 2017-01-21 NOTE — Telephone Encounter (Signed)
Left message for patient to return call.

## 2017-02-03 ENCOUNTER — Other Ambulatory Visit (INDEPENDENT_AMBULATORY_CARE_PROVIDER_SITE_OTHER): Payer: 59

## 2017-02-03 ENCOUNTER — Encounter: Payer: Self-pay | Admitting: Internal Medicine

## 2017-02-03 ENCOUNTER — Ambulatory Visit (INDEPENDENT_AMBULATORY_CARE_PROVIDER_SITE_OTHER): Payer: 59 | Admitting: Internal Medicine

## 2017-02-03 VITALS — BP 120/80 | HR 76 | Ht 61.0 in | Wt 164.5 lb

## 2017-02-03 DIAGNOSIS — R110 Nausea: Secondary | ICD-10-CM | POA: Diagnosis not present

## 2017-02-03 DIAGNOSIS — R197 Diarrhea, unspecified: Secondary | ICD-10-CM

## 2017-02-03 LAB — IGA: IGA: 161 mg/dL (ref 68–378)

## 2017-02-03 NOTE — Patient Instructions (Signed)
Your physician has requested that you go to the basement for the following lab work before leaving today:  TTG, IGA  You have been scheduled for a colonoscopy. Please follow written instructions given to you at your visit today.  Please pick up your prep supplies at the pharmacy within the next 1-3 days. If you use inhalers (even only as needed), please bring them with you on the day of your procedure. Your physician has requested that you go to www.startemmi.com and enter the access code given to you at your visit today. This web site gives a general overview about your procedure. However, you should still follow specific instructions given to you by our office regarding your preparation for the procedure.   

## 2017-02-03 NOTE — Progress Notes (Signed)
HISTORY OF PRESENT ILLNESS:  [name] is a 46 y.o. female , [employer] employee, who was evaluated 11/18/2016 regarding problems with intermittent nausea and vomiting. See that dictation for details. She underwent extensive workup including upper endoscopy, abdominal ultrasound, HIDA scan, and gastric emptying scan. These were all normal. She was prescribed omeprazole 40 mg daily. She presents today for follow-up. She has a new complaint of diarrhea. She describes multiple loose bowel movements in the morning after coffee. This slows down the beginning of her day. She describes this as a new since. Been going on for a month or so. Currently taking omeprazole at night. No significant nausea. Reports that she might vomit in the mornings about once per week. Afternoon and evenings seem to be okay. No problems sleeping. No weight change. Some bloating. She is concerned because her father has a history of colon polyps. She is also taking ranitidine 150 mg twice daily. She wonders about occult gallbladder disease despite the negative workup. She does have a background of intermittent right upper quadrant pain dating back many years with previous workup elsewhere  REVIEW OF SYSTEMS:  All non-GI ROS negative except for increased thirst and increased urination. Fatigue  Past Medical History:  Diagnosis Date  . Allergy   . Chronic female pelvic pain    improved after hysterectomy  . Chronic kidney disease   . Dry eye syndrome    and eczema  . History of nephrolithiasis   . History of syncope    during pregnancy    Past Surgical History:  Procedure Laterality Date  . APPENDECTOMY    . COLONOSCOPY  2003   Dr. Isa Rankin  . TONSILLECTOMY    . VAGINAL HYSTERECTOMY      Social History [name]  reports that she has been smoking Cigarettes.  She has been smoking about 0.50 packs per day. She has never used smokeless tobacco. She reports that she drinks alcohol. She reports that she does not use  drugs.  family history includes Colon polyps in her father; Coronary artery disease in her paternal grandfather; Diabetes type II in her paternal grandfather; Hyperlipidemia in her paternal grandfather; Uterine cancer in her mother.  Allergies  Allergen Reactions  . Morphine And Related        PHYSICAL EXAMINATION: Vital signs: BP 120/80 (BP Location: Left Arm, Patient Position: Sitting, Cuff Size: Normal)   Pulse 76   Ht 5\' 1"  (1.549 m) Comment: height measured without shoes  Wt 164 lb 8 oz (74.6 kg)   BMI 31.08 kg/m   Constitutional: generally well-appearing, no acute distress Psychiatric: alert and oriented x3, cooperative Eyes: extraocular movements intact, anicteric, conjunctiva pink Mouth: oral pharynx moist, no lesions Neck: supple no lymphadenopathy Cardiovascular: heart regular rate and rhythm, no murmur Lungs: clear to auscultation bilaterally Abdomen: soft, nontender, nondistended, no obvious ascites, no peritoneal signs, normal bowel sounds, no organomegaly Rectal:Deferred until colonoscopy Extremities: no clubbing cyanosis or lower extremity edema bilaterally Skin: no lesions on visible extremities Neuro: No focal deficits. Normal DTRs  ASSESSMENT:  #1. Problems with morning diarrhea and urgency as described. Etiology unclear as she states this is new. May be PPI related #2. Nausea with vomiting. Significantly improved. May be related to acid suppressive therapy  PLAN:  #1. Colonoscopy with biopsies to rule out microscopic colitis.The nature of the procedure, as well as the risks, benefits, and alternatives were carefully and thoroughly reviewed with the patient. Ample time for discussion and questions allowed. The patient understood,  was satisfied, and agreed to proceed. #2. Continue PPI and H2 receptor antagonist therapy at this time (patient's preference) #3. Screening serologies for celiac disease. She will go to the laboratory today. We will call her with  results. Rule out sprue #4. If Colonoscopy with biopsies negative consider stopping PPI, Colestid therapy, or antispasmodics

## 2017-02-04 LAB — TISSUE TRANSGLUTAMINASE, IGA: TISSUE TRANSGLUTAMINASE AB, IGA: 1 U/mL (ref <4)

## 2017-02-08 ENCOUNTER — Ambulatory Visit (AMBULATORY_SURGERY_CENTER): Payer: 59 | Admitting: Internal Medicine

## 2017-02-08 ENCOUNTER — Encounter: Payer: Self-pay | Admitting: Internal Medicine

## 2017-02-08 VITALS — BP 102/48 | HR 60 | Temp 98.0°F | Resp 14 | Ht 61.0 in | Wt 164.0 lb

## 2017-02-08 DIAGNOSIS — R197 Diarrhea, unspecified: Secondary | ICD-10-CM | POA: Diagnosis present

## 2017-02-08 DIAGNOSIS — K219 Gastro-esophageal reflux disease without esophagitis: Secondary | ICD-10-CM | POA: Diagnosis not present

## 2017-02-08 DIAGNOSIS — R109 Unspecified abdominal pain: Secondary | ICD-10-CM | POA: Diagnosis not present

## 2017-02-08 DIAGNOSIS — Z8371 Family history of colonic polyps: Secondary | ICD-10-CM | POA: Diagnosis not present

## 2017-02-08 MED ORDER — SODIUM CHLORIDE 0.9 % IV SOLN: 500.0000 mL | INTRAVENOUS | Status: DC

## 2017-02-08 NOTE — Patient Instructions (Signed)
Impression/Recommendations:  Hemorrhoid handout given to patient.  Await pathology results.YOU HAD AN ENDOSCOPIC PROCEDURE TODAY AT [facility]:   Refer to the procedure report that was given to you for any specific questions about what was found during the examination.  If the procedure report does not answer your questions, please call your gastroenterologist to clarify.  If you requested that your care partner not be given the details of your procedure findings, then the procedure report has been included in a sealed envelope for you to review at your convenience later.  YOU SHOULD EXPECT: Some feelings of bloating in the abdomen. Passage of more gas than usual.  Walking can help get rid of the air that was put into your GI tract during the procedure and reduce the bloating. If you had a lower endoscopy (such as a colonoscopy or flexible sigmoidoscopy) you may notice spotting of blood in your stool or on the toilet paper. If you underwent a bowel prep for your procedure, you may not have a normal bowel movement for a few days.  Please Note:  You might notice some irritation and congestion in your nose or some drainage.  This is from the oxygen used during your procedure.  There is no need for concern and it should clear up in a day or so.  SYMPTOMS TO REPORT IMMEDIATELY:   Following lower endoscopy (colonoscopy or flexible sigmoidoscopy):  Excessive amounts of blood in the stool  Significant tenderness or worsening of abdominal pains  Swelling of the abdomen that is new, acute  Fever of 100F or higher For urgent or emergent issues, a gastroenterologist can be reached at any hour by calling [phone].   DIET:  We do recommend a small meal at first, but then you may proceed to your regular diet.  Drink plenty of fluids but you should avoid alcoholic beverages for 24 hours.  ACTIVITY:  You should plan to take it easy for the rest of today and you should NOT DRIVE or use  heavy machinery until tomorrow (because of the sedation medicines used during the test).    FOLLOW UP: Our staff will call the number listed on your records the next business day following your procedure to check on you and address any questions or concerns that you may have regarding the information given to you following your procedure. If we do not reach you, we will leave a message.  However, if you are feeling well and you are not experiencing any problems, there is no need to return our call.  We will assume that you have returned to your regular daily activities without incident.  If any biopsies were taken you will be contacted by phone or by letter within the next 1-3 weeks.  Please call us at [phone] if you have not heard about the biopsies in 3 weeks.    SIGNATURES/CONFIDENTIALITY: You and/or your care partner have signed paperwork which will be entered into your electronic medical record.  These signatures attest to the fact that that the information above on your After Visit Summary has been reviewed and is understood.  Full responsibility of the confidentiality of this discharge information lies with you and/or your care-partner.  Repeat colonoscopy in 10 years for surveillance.

## 2017-02-08 NOTE — Progress Notes (Signed)
Report given to PACU, vss 

## 2017-02-08 NOTE — Op Note (Signed)
[place] Patient Name: [name] Procedure Date: 02/08/2017 10:41 AM MRN: [id number] Endoscopist: Docia Chuck. Henrene Pastor , MD Age: 46 Referring MD:  Date of Birth: 10-22-1971 Gender: Female Account #: [id number] Procedure:                Colonoscopy, with biopsies Indications:              Abdominal pain, Clinically significant diarrhea of                            unexplained origin. Family history colon polyps Medicines:                Monitored Anesthesia Care Procedure:                Pre-Anesthesia Assessment:                           - Prior to the procedure, a History and Physical                            was performed, and patient medications and                            allergies were reviewed. The patient's tolerance of                            previous anesthesia was also reviewed. The risks                            and benefits of the procedure and the sedation                            options and risks were discussed with the patient.                            All questions were answered, and informed consent                            was obtained. Prior Anticoagulants: The patient has                            taken no previous anticoagulant or antiplatelet                            agents. ASA Grade Assessment: I - A normal, healthy                            patient. After reviewing the risks and benefits,                            the patient was deemed in satisfactory condition to                            undergo the procedure.  After obtaining informed consent, the colonoscope                            was passed under direct vision. Throughout the                            procedure, the patient's blood pressure, pulse, and                            oxygen saturations were monitored continuously. The                            Model CF-HQ190L [id number]) scope was introduced                            through the  anus and advanced to the the cecum,                            identified by appendiceal orifice and ileocecal                            valve. The terminal ileum, ileocecal valve,                            appendiceal orifice, and rectum were photographed.                            The quality of the bowel preparation was excellent.                            The colonoscopy was performed without difficulty.                            The patient tolerated the procedure well. The bowel                            preparation used was SUPREP. Scope In: 10:51:37 AM Scope Out: 11:03:06 AM Scope Withdrawal Time: 0 hours 9 minutes 17 seconds  Total Procedure Duration: 0 hours 11 minutes 29 seconds  Findings:                 The terminal ileum appeared normal.                           Internal hemorrhoids were found during                            retroflexion. The hemorrhoids were small.                           The entire examined colon appeared normal on direct                            and retroflexion views. Biopsies for histology were  taken with a cold forceps from the entire colon for                            evaluation of microscopic colitis. Complications:            No immediate complications. Estimated blood loss:                            None. Estimated Blood Loss:     Estimated blood loss: none. Impression:               - The examined portion of the ileum was normal.                           - Internal hemorrhoids.                           - The entire examined colon is normal on direct and                            retroflexion views. Recommendation:           - Repeat colonoscopy in 10 years for surveillance.                           - Patient has a contact number available for                            emergencies. The signs and symptoms of potential                            delayed complications were discussed with the                             patient. Return to normal activities tomorrow.                            Written discharge instructions were provided to the                            patient.                           - Resume previous diet.                           - Continue present medications.                           - Await pathology results. We will send you a                            letter with results and further recommendations. Docia Chuck. Henrene Pastor, MD 02/08/2017 11:07:57 AM This report has been signed electronically.

## 2017-02-08 NOTE — Progress Notes (Signed)
Called to room to assist during endoscopic procedure.  Patient ID and intended procedure confirmed with present staff. Received instructions for my participation in the procedure from the performing physician.  

## 2017-02-09 ENCOUNTER — Telehealth: Payer: Self-pay

## 2017-02-09 NOTE — Telephone Encounter (Signed)
  Follow up Call-  Call back number 02/08/2017 12/03/2016  Post procedure Call Back phone  # [phone] cell [phone]  Permission to leave phone message Yes Yes  Some recent data might be hidden     Patient questions:  Do you have a fever, pain , or abdominal swelling? No. Pain Score  0 *  Have you tolerated food without any problems? Yes.    Have you been able to return to your normal activities? Yes.    Do you have any questions about your discharge instructions: Diet   No. Medications  No. Follow up visit  No.  Do you have questions or concerns about your Care? No.  Actions: * If pain score is 4 or above: No action needed, pain <4.

## 2017-02-12 ENCOUNTER — Encounter: Payer: Self-pay | Admitting: Internal Medicine

## 2017-02-12 ENCOUNTER — Telehealth: Payer: Self-pay

## 2017-02-12 ENCOUNTER — Other Ambulatory Visit: Payer: Self-pay

## 2017-02-12 MED ORDER — COLESTIPOL HCL 1 G PO TABS: 2.0000 g | ORAL_TABLET | Freq: Two times a day (BID) | ORAL | 3 refills | Status: DC

## 2017-02-12 NOTE — Telephone Encounter (Signed)
-----   Message from Irene Shipper, MD sent at 02/12/2017 12:40 PM EST ----- Regarding: Patient follow-up. Please contact the patient and let her know that her biopsies were normal. Nothing to explain diarrhea. If she is still having problems with diarrhea please prescribed Colestid 2 g twice daily not to be taken within 2 hours of other medications. If she is doing better, no need to prescribe any therapy. Either way, have her follow up with me in about 6 weeks. I also sent her a letter. Thanks

## 2017-02-12 NOTE — Telephone Encounter (Signed)
Spoke with pt and she is aware, script sent to pharmacy. Pt scheduled to see Dr. Henrene Pastor 03/31/17@9am . Pt aware of appt.

## 2017-02-17 MED FILL — valACYclovir HCL 1 GM TABS: 1 | 7 days supply | Qty: 14 | Fill #1

## 2017-02-19 ENCOUNTER — Ambulatory Visit: Payer: 59 | Admitting: Medical

## 2017-02-24 ENCOUNTER — Ambulatory Visit (INDEPENDENT_AMBULATORY_CARE_PROVIDER_SITE_OTHER): Payer: 59 | Admitting: Medical

## 2017-02-24 ENCOUNTER — Encounter: Payer: Self-pay | Admitting: Medical

## 2017-02-24 VITALS — BP 121/64 | HR 72 | Temp 98.4°F | Resp 16 | Ht 61.0 in | Wt 162.4 lb

## 2017-02-24 DIAGNOSIS — R195 Other fecal abnormalities: Secondary | ICD-10-CM

## 2017-02-24 DIAGNOSIS — B351 Tinea unguium: Secondary | ICD-10-CM | POA: Diagnosis not present

## 2017-02-24 MED ORDER — CICLOPIROX 8 % EX SOLN: Freq: Every day | CUTANEOUS | 0 refills | Status: DC

## 2017-02-24 MED FILL — CICLOPIROX 8% SOLUTION: 8 | 30 days supply | Qty: 7 | Fill #0

## 2017-02-24 NOTE — Progress Notes (Signed)
Pre visit review using our clinic review tool, if applicable. No additional management support is needed unless otherwise documented below in the visit note/SLS  

## 2017-02-24 NOTE — Progress Notes (Signed)
Subjective:    Patient ID: [name], female    DOB: 1971-04-12, 46 y.o.   MRN: [id number]  HPI  Pt in for evaluation of toe nail fungus. She has some yellow discolored nails to both large great toes as well as some faint discolored  appearnce to small toe nails. Pt states this has been progressively thicker and flaky for about a year.   Pt continues to have a lot of GI symptoms. Chronic intermittent loose stools. Pt indicates she wants second opinion with Dr. Amedeo Plenty.  Pt states nausea and vomiting is better with omeprazole and ranitidine. Only vomits one time a day. Pt had negative h pylori. Also had negative celiac testing.   Pt expresses that she does not want to be on oral medications for toe nail fungus.    Review of Systems  Constitutional: Negative for chills, fatigue and fever.  Respiratory: Negative for apnea, cough, choking, shortness of breath and wheezing.   Cardiovascular: Negative for chest pain and palpitations.  Gastrointestinal: Negative for abdominal pain.       Daily loose stools  Musculoskeletal: Negative for back pain.  Skin: Negative for rash.       Toenail fungus  Neurological: Negative for dizziness, seizures, syncope, weakness and headaches.  Hematological: Negative for adenopathy. Does not bruise/bleed easily.  Psychiatric/Behavioral: Negative for behavioral problems.   Past Medical History:  Diagnosis Date  . Allergy   . Anemia   . Chronic female pelvic pain    improved after hysterectomy  . Chronic kidney disease   . Dry eye syndrome    and eczema  . GERD (gastroesophageal reflux disease)   . History of nephrolithiasis   . History of syncope    during pregnancy     Social History   Social History  . Marital status: Married    Spouse name: N/A  . Number of children: N/A  . Years of education: N/A   Occupational History  . Not on file.   Social History Main Topics  . Smoking status: Current Every Day Smoker    Packs/day: 0.25   Types: Cigarettes  . Smokeless tobacco: Never Used     Comment: 5 cigarettes daily  . Alcohol use 0.0 oz/week     Comment: rarely  . Drug use: No  . Sexual activity: Not on file   Other Topics Concern  . Not on file   Social History Narrative   Education officer, environmental for [employer]   Married 6 years   Children-teenagers    Past Surgical History:  Procedure Laterality Date  . APPENDECTOMY    . COLONOSCOPY  2003   Dr. Isa Rankin  . TONSILLECTOMY    . UPPER GASTROINTESTINAL ENDOSCOPY    . VAGINAL HYSTERECTOMY      Family History  Problem Relation Age of Onset  . Uterine cancer Mother   . Colon polyps Father   . Diabetes type II Paternal Grandfather   . Coronary artery disease Paternal Grandfather   . Hyperlipidemia Paternal Grandfather   . Hypertension      maternal & Paternal grandfather  . Stomach cancer Other   . Breast cancer Neg Hx   . Colon cancer Neg Hx   . Rectal cancer Neg Hx   . Pancreatic cancer Neg Hx     Allergies  Allergen Reactions  . Morphine And Related     Nausea and vomiting    Current Outpatient Prescriptions on File Prior to Visit  Medication Sig Dispense  Refill  . cetirizine (ZYRTEC) 10 MG tablet Take 10 mg by mouth daily.    . fluorometholone (FML) 0.1 % ophthalmic suspension Place 1 drop into both eyes 2 (two) times daily.  0  . omeprazole (PRILOSEC) 40 MG capsule Take 1 capsule (40 mg total) by mouth daily. 30 capsule 6  . ranitidine (ZANTAC) 150 MG capsule Take 1 capsule (150 mg total) by mouth 2 (two) times daily. 60 capsule 0  . colestipol (COLESTID) 1 g tablet Take 2 tablets (2 g total) by mouth 2 (two) times daily. (Patient not taking: Reported on 02/24/2017) 120 tablet 3   Current Facility-Administered Medications on File Prior to Visit  Medication Dose Route Frequency Provider Last Rate Last Dose  . 0.9 %  sodium chloride infusion  500 mL Intravenous Continuous Irene Shipper, MD      . 0.9 %  sodium chloride infusion  500 mL Intravenous  Continuous Irene Shipper, MD        BP 121/64 (BP Location: Left Arm, Patient Position: Sitting, Cuff Size: Large)   Pulse 72   Temp 98.4 F (36.9 C) (Oral)   Resp 16   Ht 5\' 1"  (1.549 m)   Wt 162 lb 6 oz (73.7 kg)   SpO2 100%   BMI 30.68 kg/m       Objective:   Physical Exam  General- No acute distress. Pleasant patient. Neck- Full range of motion, no jvd Lungs- Clear, even and unlabored. Heart- regular rate and rhythm. Neurologic- CNII- XII grossly intact.  Abd-soft,nontender, nd, +bs, no rebound or guarding.   Both feet- both great toes mild thick disfigured. Small nails mild disfigured.       Assessment & Plan:  For toenail fungus will rx penlac but talk with pharmacist to see if this is covered.  Will refer you to new GI for second opinion.   Follow up date to be determined. Will let you know if penlac covered.  Discussed lamisil vs penlac. Pt wants to try topical.  Wynter Isaacs, [name], PA-C

## 2017-02-24 NOTE — Patient Instructions (Signed)
For toenail fungus will rx penlac but talk with pharmacist to see if this is covered.  Will refer you to new GI for second opinion.   Follow up date to be determined. Will let you know if penlac covered.

## 2017-03-17 MED FILL — OMEPRAZOLE DR 40 MG CAPSULE: 40 | 30 days supply | Qty: 30 | Fill #2

## 2017-03-31 ENCOUNTER — Ambulatory Visit: Payer: 59 | Admitting: Internal Medicine

## 2017-04-08 DIAGNOSIS — R1011 Right upper quadrant pain: Secondary | ICD-10-CM | POA: Diagnosis not present

## 2017-04-08 DIAGNOSIS — K219 Gastro-esophageal reflux disease without esophagitis: Secondary | ICD-10-CM | POA: Diagnosis not present

## 2017-04-08 DIAGNOSIS — R112 Nausea with vomiting, unspecified: Secondary | ICD-10-CM | POA: Diagnosis not present

## 2017-04-08 DIAGNOSIS — R140 Abdominal distension (gaseous): Secondary | ICD-10-CM | POA: Diagnosis not present

## 2017-04-08 DIAGNOSIS — K580 Irritable bowel syndrome with diarrhea: Secondary | ICD-10-CM | POA: Diagnosis not present

## 2017-04-09 DIAGNOSIS — J018 Other acute sinusitis: Secondary | ICD-10-CM | POA: Diagnosis not present

## 2017-04-09 MED FILL — AMOX-CLAV 875-125 MG TABLET: 875-125 | 7 days supply | Qty: 14 | Fill #0

## 2017-04-15 MED FILL — valACYclovir HCL 1 GM TABS: 1 | 7 days supply | Qty: 14 | Fill #2

## 2017-04-19 DIAGNOSIS — J3089 Other allergic rhinitis: Secondary | ICD-10-CM | POA: Diagnosis not present

## 2017-04-19 DIAGNOSIS — L209 Atopic dermatitis, unspecified: Secondary | ICD-10-CM | POA: Diagnosis not present

## 2017-04-19 DIAGNOSIS — T781XXA Other adverse food reactions, not elsewhere classified, initial encounter: Secondary | ICD-10-CM | POA: Diagnosis not present

## 2017-04-19 DIAGNOSIS — J301 Allergic rhinitis due to pollen: Secondary | ICD-10-CM | POA: Diagnosis not present

## 2017-04-30 MED FILL — OMEPRAZOLE DR 40 MG CAPSULE: 40 | 30 days supply | Qty: 30 | Fill #3

## 2017-05-04 ENCOUNTER — Other Ambulatory Visit: Payer: Self-pay | Admitting: Surgery

## 2017-05-04 DIAGNOSIS — K811 Chronic cholecystitis: Secondary | ICD-10-CM | POA: Diagnosis not present

## 2017-05-15 ENCOUNTER — Emergency Department (HOSPITAL_COMMUNITY)
Admission: EM | Admit: 2017-05-15 | Discharge: 2017-05-16 | Disposition: A | Discharge status: Home / Self Care | Payer: 59 | Attending: Emergency Medicine | Admitting: Emergency Medicine

## 2017-05-15 ENCOUNTER — Emergency Department (HOSPITAL_COMMUNITY): Payer: 59

## 2017-05-15 ENCOUNTER — Encounter (HOSPITAL_COMMUNITY): Payer: Self-pay | Admitting: Emergency Medicine

## 2017-05-15 DIAGNOSIS — N189 Chronic kidney disease, unspecified: Secondary | ICD-10-CM | POA: Insufficient documentation

## 2017-05-15 DIAGNOSIS — F17210 Nicotine dependence, cigarettes, uncomplicated: Secondary | ICD-10-CM | POA: Insufficient documentation

## 2017-05-15 DIAGNOSIS — R109 Unspecified abdominal pain: Secondary | ICD-10-CM | POA: Diagnosis present

## 2017-05-15 DIAGNOSIS — R140 Abdominal distension (gaseous): Secondary | ICD-10-CM | POA: Insufficient documentation

## 2017-05-15 DIAGNOSIS — R1013 Epigastric pain: Secondary | ICD-10-CM | POA: Diagnosis not present

## 2017-05-15 DIAGNOSIS — R1084 Generalized abdominal pain: Secondary | ICD-10-CM | POA: Diagnosis not present

## 2017-05-15 LAB — COMPREHENSIVE METABOLIC PANEL
ALK PHOS: 61 U/L (ref 38–126)
ALT: 14 U/L (ref 14–54)
ANION GAP: 8 (ref 5–15)
AST: 16 U/L (ref 15–41)
Albumin: 3.9 g/dL (ref 3.5–5.0)
BUN: 6 mg/dL (ref 6–20)
CALCIUM: 9.1 mg/dL (ref 8.9–10.3)
CO2: 23 mmol/L (ref 22–32)
CREATININE: 0.76 mg/dL (ref 0.44–1.00)
Chloride: 106 mmol/L (ref 101–111)
Glucose, Bld: 89 mg/dL (ref 65–99)
Potassium: 3.8 mmol/L (ref 3.5–5.1)
SODIUM: 137 mmol/L (ref 135–145)
Total Bilirubin: 0.6 mg/dL (ref 0.3–1.2)
Total Protein: 6.8 g/dL (ref 6.5–8.1)

## 2017-05-15 LAB — URINALYSIS, ROUTINE W REFLEX MICROSCOPIC
Bilirubin Urine: NEGATIVE
Glucose, UA: NEGATIVE mg/dL
Hgb urine dipstick: NEGATIVE
Ketones, ur: NEGATIVE mg/dL
LEUKOCYTES UA: NEGATIVE
NITRITE: NEGATIVE
PROTEIN: NEGATIVE mg/dL
SPECIFIC GRAVITY, URINE: 1.003 — AB (ref 1.005–1.030)
pH: 6 (ref 5.0–8.0)

## 2017-05-15 LAB — LIPASE, BLOOD: LIPASE: 39 U/L (ref 11–51)

## 2017-05-15 LAB — CBC
HCT: 41.8 % (ref 36.0–46.0)
HEMOGLOBIN: 14.2 g/dL (ref 12.0–15.0)
MCH: 31.4 pg (ref 26.0–34.0)
MCHC: 34 g/dL (ref 30.0–36.0)
MCV: 92.5 fL (ref 78.0–100.0)
PLATELETS: 265 10*3/uL (ref 150–400)
RBC: 4.52 MIL/uL (ref 3.87–5.11)
RDW: 12.6 % (ref 11.5–15.5)
WBC: 8.8 10*3/uL (ref 4.0–10.5)

## 2017-05-15 MED ORDER — IOPAMIDOL (ISOVUE-300) INJECTION 61%
INTRAVENOUS | Status: AC
  Administered 2017-05-15: 30 mL
  Filled 2017-05-15: qty 30

## 2017-05-15 MED ORDER — ONDANSETRON 4 MG PO TBDP: 4.0000 mg | ORAL_TABLET | ORAL | 0 refills | Status: DC | PRN

## 2017-05-15 MED ORDER — SODIUM CHLORIDE 0.9 % IV SOLN
Freq: Once | INTRAVENOUS | Status: AC
  Administered 2017-05-15: 21:00:00 via INTRAVENOUS

## 2017-05-15 MED ORDER — IOPAMIDOL (ISOVUE-300) INJECTION 61%
INTRAVENOUS | Status: AC
  Administered 2017-05-15: 100 mL
  Filled 2017-05-15: qty 100

## 2017-05-15 NOTE — ED Provider Notes (Signed)
[facility] DEPT Provider Note   CSN: [id number] Arrival date & time: 05/15/17  1551     History   Chief Complaint Chief Complaint  Patient presents with  . Abdominal Pain    HPI [name] is a 46 y.o. female.  HPI Patient for she been having ongoing problems with recurrent and copious diarrhea and abdominal bloating. She has been seen by gastroenterology with both upper and lower endoscopies done. She also reports that she has had stool testing as well as food allergy testing done. No etiology could be identified. She reports her gastroenterologist has determined that this is a gallbladder malfunction disorder and the patient is scheduled to have cholecystectomy at the end of this month. Or she presents to the emergency department today because she had increased bloating and distention. She reports its pressure and very uncomfortable. She developed nausea and had an episode of vomiting. She reports that also all foods will trigger symptoms. She is taking both Zantac and Prilosec. Past Medical History:  Diagnosis Date  . Allergy   . Anemia   . Chronic female pelvic pain    improved after hysterectomy  . Chronic kidney disease   . Dry eye syndrome    and eczema  . GERD (gastroesophageal reflux disease)   . History of nephrolithiasis   . History of syncope    during pregnancy    Patient Active Problem List   Diagnosis Date Noted  . Trapezius strain 11/30/2014  . Migraine 11/16/2014  . Migraines 11/08/2014  . 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 Headache(784.0) 09/01/2012  . Angiomyolipoma of kidney, left 05/05/2012  . Abdominal pain 05/01/2012  . COCCYGEAL PAIN 09/26/2009  . PELVIC  PAIN 05/31/2009  . TOBACCO ABUSE 03/26/2009  . EDEMA 03/26/2009  . WEIGHT GAIN 03/26/2009  . NEPHROLITHIASIS, HX OF 03/26/2009    Past Surgical History:  Procedure Laterality Date  . APPENDECTOMY    . COLONOSCOPY  2003   Dr. Isa Rankin  . TONSILLECTOMY    . UPPER GASTROINTESTINAL  ENDOSCOPY    . VAGINAL HYSTERECTOMY      OB History    No data available       Home Medications    Prior to Admission medications   Medication Sig Start Date End Date Taking? Authorizing Provider  cetirizine (ZYRTEC) 10 MG tablet Take 10 mg by mouth daily.    [provider]  ciclopirox (PENLAC) 8 % solution Apply topically at bedtime. Apply over nail and surrounding skin. Apply daily over previous coat. After seven (7) days, may remove with alcohol and continue cycle. 02/24/17   Saguier, [name], PA-C  colestipol (COLESTID) 1 g tablet Take 2 tablets (2 g total) by mouth 2 (two) times daily. Patient not taking: Reported on 02/24/2017 02/12/17   Irene Shipper, MD  fluorometholone (FML) 0.1 % ophthalmic suspension Place 1 drop into both eyes 2 (two) times daily. 01/15/17   [provider]  omeprazole (PRILOSEC) 40 MG capsule Take 1 capsule (40 mg total) by mouth daily. 11/18/16   Irene Shipper, MD  ondansetron (ZOFRAN ODT) 4 MG disintegrating tablet Take 1 tablet (4 mg total) by mouth every 4 (four) hours as needed for nausea or vomiting. 05/15/17   Charlesetta Shanks, MD  ranitidine (ZANTAC) 150 MG capsule Take 1 capsule (150 mg total) by mouth 2 (two) times daily. 11/12/16   Saguier, [name], PA-C    Family History Family History  Problem Relation Age of Onset  . Uterine cancer Mother   .  Colon polyps Father   . Diabetes type II Paternal Grandfather   . Coronary artery disease Paternal Grandfather   . Hyperlipidemia Paternal Grandfather   . Hypertension Unknown        maternal & Paternal grandfather  . Stomach cancer Other   . Breast cancer Neg Hx   . Colon cancer Neg Hx   . Rectal cancer Neg Hx   . Pancreatic cancer Neg Hx     Social History Social History  Substance Use Topics  . Smoking status: Current Every Day Smoker    Packs/day: 0.25    Types: Cigarettes  . Smokeless tobacco: Never Used     Comment: 5 cigarettes daily  . Alcohol use 0.0 oz/week      Comment: rarely     Allergies   Morphine and related   Review of Systems Review of Systems  10 Systems reviewed and are negative for acute change except as noted in the HPI. Physical Exam Updated Vital Signs BP 108/68   Pulse (!) 59   Temp 98.5 F (36.9 C) (Oral)   Resp 16   SpO2 100%   Physical Exam  Constitutional: She is oriented to person, place, and time. She appears well-developed and well-nourished. No distress.  HENT:  Head: Normocephalic and atraumatic.  Mouth/Throat: Oropharynx is clear and moist.  Eyes: Conjunctivae and EOM are normal.  Neck: Neck supple.  Cardiovascular: Normal rate and regular rhythm.   No murmur heard. Pulmonary/Chest: Effort normal and breath sounds normal. No respiratory distress.  Abdominal: Soft. Bowel sounds are normal. She exhibits distension. There is tenderness.  Mild to moderate left lower abdominal discomfort to palpation.  Musculoskeletal: Normal range of motion. She exhibits no edema or tenderness.  Neurological: She is alert and oriented to person, place, and time. No cranial nerve deficit. She exhibits normal muscle tone. Coordination normal.  Skin: Skin is warm and dry.  Psychiatric: She has a normal mood and affect.  Nursing note and vitals reviewed.    ED Treatments / Results  Labs (all labs ordered are listed, but only abnormal results are displayed) Labs Reviewed  URINALYSIS, ROUTINE W REFLEX MICROSCOPIC - Abnormal; Notable for the following:       Result Value   Color, Urine STRAW (*)    Specific Gravity, Urine 1.003 (*)    All other components within normal limits  LIPASE, BLOOD  COMPREHENSIVE METABOLIC PANEL  CBC    EKG  EKG Interpretation None       Radiology Ct Abdomen Pelvis W Contrast  Result Date: 05/15/2017 CLINICAL DATA:  Epigastric pain EXAM: CT ABDOMEN AND PELVIS WITH CONTRAST TECHNIQUE: Multidetector CT imaging of the abdomen and pelvis was performed using the standard protocol following  bolus administration of intravenous contrast. CONTRAST:  191mL ISOVUE-300 IOPAMIDOL (ISOVUE-300) INJECTION 61%, 9mL ISOVUE-300 IOPAMIDOL (ISOVUE-300) INJECTION 61% COMPARISON:  CT abdomen pelvis 05/31/2009 FINDINGS: Lower chest: No pulmonary nodules. No visible pleural or pericardial effusion. Hepatobiliary: Normal hepatic size and contours without focal liver lesion. No perihepatic ascites. No intra- or extrahepatic biliary dilatation. Normal gallbladder. Pancreas: Normal pancreatic contours and enhancement. No peripancreatic fluid collection or pancreatic ductal dilatation. Spleen: Normal. Adrenals/Urinary Tract: Normal adrenal glands. No hydronephrosis or solid renal mass. Stomach/Bowel: There is no hiatal hernia. The stomach and duodenum are normal. There is no dilated small bowel or enteric inflammation. There is a dilated area of sigmoid colon within internal air-fluid level, possibly a large diverticulum. No evidence of a inflammation at this site. Status post  appendectomy. Vascular/Lymphatic: Normal course and caliber of the major abdominal vessels. No abdominal or pelvic adenopathy. Reproductive: Status post hysterectomy.  No adnexal mass. Musculoskeletal: Lower lumbar facet arthrosis without bony spinal canal stenosis. Normal visualized extrathoracic and extraperitoneal soft tissues. Other: No contributory non-categorized findings. IMPRESSION: 1. No acute abnormality of the abdomen or pelvis. 2. Focal dilated area of the sigmoid colon with internal air-fluid level, possibly a diverticulum. No evidence of inflammation or proximal obstruction. Electronically Signed   By: Ulyses Jarred M.D.   On: 05/15/2017 23:39    Procedures Procedures (including critical care time)  Medications Ordered in ED Medications  0.9 %  sodium chloride infusion ( Intravenous New Bag/Given 05/15/17 2057)  iopamidol (ISOVUE-300) 61 % injection (30 mLs  Contrast Given 05/15/17 2100)  iopamidol (ISOVUE-300) 61 % injection (100  mLs  Contrast Given 05/15/17 2305)     Initial Impression / Assessment and Plan / ED Course  I have reviewed the triage vital signs and the nursing notes.  Pertinent labs & imaging results that were available during my care of the patient were reviewed by me and considered in my medical decision making (see chart for details).     Final Clinical Impressions(s) / ED Diagnoses   Final diagnoses:  Generalized abdominal pain  Abdominal distention  At this time, diagnostic studies are within normal limits. CT identifies a dilated area of sigmoid colon, per radiology possibly a large diverticulum. There is no bowel wall inflammation, obstruction or other concerning findings. The patient's abdominal examination is nonsurgical. At this time plan will be for her to contact her gastroenterologist to follow-up on these findings to determine if there is any correlation to the patient's chronic diarrheal symptoms. She is counseled on dietary measures for gallbladder disease and prescribed Zofran. Patient will continue her prescribed Prilosec and Xantac. New Prescriptions New Prescriptions   ONDANSETRON (ZOFRAN ODT) 4 MG DISINTEGRATING TABLET    Take 1 tablet (4 mg total) by mouth every 4 (four) hours as needed for nausea or vomiting.     Charlesetta Shanks, MD 05/16/17 0005

## 2017-05-15 NOTE — ED Triage Notes (Signed)
Rt sidedc abd pain for months and is to have GB surgery may 30 but she has had increasing pain and feels like her abd is swollen , last bm was today, states has voitied today

## 2017-05-15 NOTE — Discharge Instructions (Signed)
Call your gastroenterologist Monday to let them know you're seen in emergency department and to review her CT findings.

## 2017-05-15 NOTE — ED Notes (Signed)
Patient transported to CT 

## 2017-05-16 NOTE — ED Notes (Signed)
Patient Alert and oriented X4. Stable and ambulatory. Patient verbalized understanding of the discharge instructions.  Patient belongings were taken by the patient.  

## 2017-05-20 NOTE — Pre-Procedure Instructions (Signed)
[name]  05/20/2017      [place], [place] - [address] [facility]. [address] [place] [address] Phone: [phone] Fax: [phone]    Your procedure is scheduled on May 26, 2017.  Report to [facility] Admitting at 530 AM.  Call this number if you have problems the morning of surgery:[phone]   Remember:  Do not eat food or drink liquids after midnight.  Take these medicines the morning of surgery with A SIP OF WATER acetaminophen (tylenol)-if needed  7 days prior to surgery STOP taking any Aspirin, Aleve, Naproxen, Ibuprofen, Motrin, Advil, Goody's, BC's, all herbal medications, fish oil, and all vitamins   Do not wear jewelry, make-up or nail polish.  Do not wear lotions, powders, or perfumes, or deoderant.  Do not shave 48 hours prior to surgery.  Do not bring valuables to the hospital.  [facility] is not responsible for any belongings or valuables.  Contacts, dentures or bridgework may not be worn into surgery.  Leave your suitcase in the car.  After surgery it may be brought to your room.  For patients admitted to the hospital, discharge time will be determined by your treatment team.  Patients discharged the day of surgery will not be allowed to drive home.   Special instructions:  [facility]- Preparing For Surgery  Before surgery, you can play an important role. Because skin is not sterile, your skin needs to be as free of germs as possible. You can reduce the number of germs on your skin by washing with CHG (chlorahexidine gluconate) Soap before surgery.  CHG is an antiseptic cleaner which kills germs and bonds with the skin to continue killing germs even after washing.  Please do not use if you have an allergy to CHG or antibacterial soaps. If your skin becomes reddened/irritated stop using the CHG.  Do not shave (including legs and underarms) for at least 48 hours prior to first CHG shower. It  is OK to shave your face.  Please follow these instructions carefully.   1. Shower the NIGHT BEFORE SURGERY and the MORNING OF SURGERY with CHG.   2. If you chose to wash your hair, wash your hair first as usual with your normal shampoo.  3. After you shampoo, rinse your hair and body thoroughly to remove the shampoo.  4. Use CHG as you would any other liquid soap. You can apply CHG directly to the skin and wash gently with a scrungie or a clean washcloth.   5. Apply the CHG Soap to your body ONLY FROM THE NECK DOWN.  Do not use on open wounds or open sores. Avoid contact with your eyes, ears, mouth and genitals (private parts). Wash genitals (private parts) with your normal soap.  6. Wash thoroughly, paying special attention to the area where your surgery will be performed.  7. Thoroughly rinse your body with warm water from the neck down.  8. DO NOT shower/wash with your normal soap after using and rinsing off the CHG Soap.  9. Pat yourself dry with a CLEAN TOWEL.   10. Wear CLEAN PAJAMAS   11. Place CLEAN SHEETS on your bed the night of your first shower and DO NOT SLEEP WITH PETS.    Day of Surgery: Do not apply any deodorants/lotions. Please wear clean clothes to the hospital/surgery center.     Please read over the following fact sheets that you were given. Pain  Booklet, Coughing and Deep Breathing and Surgical Site Infection Prevention

## 2017-05-21 ENCOUNTER — Encounter (HOSPITAL_COMMUNITY)
Admission: RE | Admit: 2017-05-21 | Discharge: 2017-05-21 | Disposition: A | Discharge status: Home / Self Care | Payer: 59 | Source: Ambulatory Visit | Attending: Orthopaedic Surgery | Admitting: Orthopaedic Surgery

## 2017-05-21 ENCOUNTER — Encounter (HOSPITAL_COMMUNITY): Payer: Self-pay

## 2017-05-21 DIAGNOSIS — Z01812 Encounter for preprocedural laboratory examination: Secondary | ICD-10-CM | POA: Insufficient documentation

## 2017-05-21 DIAGNOSIS — K819 Cholecystitis, unspecified: Secondary | ICD-10-CM | POA: Insufficient documentation

## 2017-05-21 DIAGNOSIS — Z01818 Encounter for other preprocedural examination: Secondary | ICD-10-CM | POA: Insufficient documentation

## 2017-05-21 HISTORY — DX: Personal history of urinary calculi: Z87.442

## 2017-05-21 HISTORY — DX: Cyst of kidney, acquired: N28.1

## 2017-05-21 LAB — BASIC METABOLIC PANEL
ANION GAP: 7 (ref 5–15)
BUN: 7 mg/dL (ref 6–20)
CHLORIDE: 106 mmol/L (ref 101–111)
CO2: 25 mmol/L (ref 22–32)
Calcium: 9.1 mg/dL (ref 8.9–10.3)
Creatinine, Ser: 0.87 mg/dL (ref 0.44–1.00)
GFR calc Af Amer: >60 mL/min (ref >60)
GFR calc non Af Amer: >60 mL/min (ref >60)
GLUCOSE: 89 mg/dL (ref 65–99)
Potassium: 4 mmol/L (ref 3.5–5.1)
Sodium: 138 mmol/L (ref 135–145)

## 2017-05-21 LAB — CBC
HEMATOCRIT: 40.6 % (ref 36.0–46.0)
Hemoglobin: 13.7 g/dL (ref 12.0–15.0)
MCH: 31.6 pg (ref 26.0–34.0)
MCHC: 33.7 g/dL (ref 30.0–36.0)
MCV: 93.8 fL (ref 78.0–100.0)
Platelets: 252 10*3/uL (ref 150–400)
RBC: 4.33 MIL/uL (ref 3.87–5.11)
RDW: 12.8 % (ref 11.5–15.5)
WBC: 6 10*3/uL (ref 4.0–10.5)

## 2017-05-21 NOTE — Pre-Procedure Instructions (Addendum)
[name]  05/21/2017      [place], [place] - [address] [facility]. [address] [place] [address] Phone: [phone] Fax: [phone]    Your procedure is scheduled on May 26, 2017.  Report to [facility] Admitting at 530 AM.  Call this number if you have problems the morning of surgery:[phone]   Remember:  Do not eat food or drink liquids after midnight.  Take these medicines the morning of surgery with A SIP OF WATER acetaminophen (tylenol)-if needed,ranitidine (zantac) if needed   prior to surgery STOP taking any Aspirin, Aleve, Naproxen, Ibuprofen, Motrin, Advil, Goody's, BC's, all herbal medications, fish oil, and all vitamins starting Today 05/21/17   Do not wear jewelry, make-up or nail polish.  Do not wear lotions, powders, or perfumes, or deoderant.  Do not shave 48 hours prior to surgery.  Do not bring valuables to the hospital.  [facility] is not responsible for any belongings or valuables.  Contacts, dentures or bridgework may not be worn into surgery.  Leave your suitcase in the car.  After surgery it may be brought to your room.  For patients admitted to the hospital, discharge time will be determined by your treatment team.  Patients discharged the day of surgery will not be allowed to drive home.   Special instructions:  [name]- Preparing For Surgery  Before surgery, you can play an important role. Because skin is not sterile, your skin needs to be as free of germs as possible. You can reduce the number of germs on your skin by washing with CHG (chlorahexidine gluconate) Soap before surgery.  CHG is an antiseptic cleaner which kills germs and bonds with the skin to continue killing germs even after washing.  Please do not use if you have an allergy to CHG or antibacterial soaps. If your skin becomes reddened/irritated stop using the CHG.  Do not shave (including legs and underarms) for  at least 48 hours prior to first CHG shower. It is OK to shave your face.  Please follow these instructions carefully.   1. Shower the NIGHT BEFORE SURGERY and the MORNING OF SURGERY with CHG.   2. If you chose to wash your hair, wash your hair first as usual with your normal shampoo.  3. After you shampoo, rinse your hair and body thoroughly to remove the shampoo.  4. Use CHG as you would any other liquid soap. You can apply CHG directly to the skin and wash gently with a scrungie or a clean washcloth.   5. Apply the CHG Soap to your body ONLY FROM THE NECK DOWN.  Do not use on open wounds or open sores. Avoid contact with your eyes, ears, mouth and genitals (private parts). Wash genitals (private parts) with your normal soap.  6. Wash thoroughly, paying special attention to the area where your surgery will be performed.  7. Thoroughly rinse your body with warm water from the neck down.  8. DO NOT shower/wash with your normal soap after using and rinsing off the CHG Soap.  9. Pat yourself dry with a CLEAN TOWEL.   10. Wear CLEAN PAJAMAS   11. Place CLEAN SHEETS on your bed the night of your first shower and DO NOT SLEEP WITH PETS.    Day of Surgery: Do not apply any deodorants/lotions. Please wear clean clothes to the hospital/surgery center.     Please read over the  fact sheets  that you were given.

## 2017-05-25 NOTE — H&P (Signed)
[name] 05/04/2017 10:13 AM Location: [facility] Patient #: [id number] DOB: September 01, 1971 Married / Language: English / Race: White Female   History of Present Illness ([name]; 05/04/2017 10:39 AM) Patient words: New-Gallbladder.  The patient is a 46 year old female who presents with abdominal pain. This is a very pleasant patient referred by Dr. Juanita Craver for evaluation of right upper quadrant abdominal pain. She has been having right upper quadrant epigastric abdominal pain since September of last year. This started gradually as a burning discomfort and bloating with nausea after fatty meals. She now has emesis weekly after fatty meals. She also has loose bowel movements. She reports pain in her right upper quadrant which is sharp and moderate in intensity. She's had an extensive workup including endoscopy, HIDA scan, ultrasound, gastric emptying study, etc. which have all been unremarkable except for the fact that she had pain after insuring ingestion during the HIDA scan. Every female family member in her extended family has had a cholecystectomy for gallbladder disease. She is otherwise without complaints   Past Surgical History Malachi Bonds, CMA; 05/04/2017 10:20 AM) Appendectomy  Colon Polyp Removal - Colonoscopy  Colon Polyp Removal - Open  Hysterectomy (due to cancer) - Partial  Hysterectomy (not due to cancer) - Partial  Tonsillectomy   Diagnostic Studies History Malachi Bonds, CMA; 05/04/2017 10:20 AM) Mammogram  within last year  Allergies Malachi Bonds, CMA; 05/04/2017 10:14 AM) Morphine Sulfate *ANALGESICS - OPIOID*   Medication History (Chemira Jones, CMA; 05/04/2017 10:15 AM) Omeprazole (40MG  Capsule DR, Oral) Active. RaNITidine HCl (150MG  Capsule, Oral) Active. Cetirizine HCl (10MG  Tablet, Oral) Active. Medications Reconciled  Social History Malachi Bonds, CMA; 05/04/2017 10:20 AM) Alcohol use  Occasional alcohol  use. Caffeine use  Coffee, Tea. No drug use  Tobacco use  Current some day smoker.  Family History Malachi Bonds, CMA; 05/04/2017 10:20 AM) Arthritis  Mother. Cancer  Family Members In General, Mother. Cerebrovascular Accident  Family Members In General. Colon Polyps  Father. Diabetes Mellitus  Family Members In General. Heart Disease  Family Members In General. Hypertension  Family Members In General. Melanoma  Father. Ovarian Cancer  Mother.  Pregnancy / Birth History Malachi Bonds, CMA; 05/04/2017 10:20 AM) Durenda Age  1 Irregular periods  Maternal age  5-20 Para  3  Other Problems Malachi Bonds, CMA; 05/04/2017 10:20 AM) Hemorrhoids  Kidney Stone     Review of Systems Malachi Bonds CMA; 05/04/2017 10:20 AM) General Present- Weight Gain. Not Present- Appetite Loss, Chills, Fatigue, Fever, Night Sweats and Weight Loss. Skin Present- Dryness. Not Present- Change in Wart/Mole, Hives, Jaundice, New Lesions, Non-Healing Wounds, Rash and Ulcer. HEENT Present- Seasonal Allergies, Wears glasses/contact lenses and Yellow Eyes. Not Present- Earache, Hearing Loss, Hoarseness, Nose Bleed, Oral Ulcers, Ringing in the Ears, Sinus Pain, Sore Throat and Visual Disturbances. Breast Not Present- Breast Mass, Breast Pain, Nipple Discharge and Skin Changes. Cardiovascular Present- Chest Pain and Leg Cramps. Not Present- Difficulty Breathing Lying Down, Palpitations, Rapid Heart Rate, Shortness of Breath and Swelling of Extremities. Gastrointestinal Present- Abdominal Pain, Bloating, Bloody Stool, Change in Bowel Habits, Chronic diarrhea, Excessive gas, Gets full quickly at meals, Hemorrhoids, Indigestion, Nausea and Vomiting. Not Present- Constipation, Difficulty Swallowing and Rectal Pain. Female Genitourinary Present- Frequency and Urgency. Not Present- Nocturia, Painful Urination and Pelvic Pain. Musculoskeletal Present- Back Pain and Joint Pain. Not Present- Joint Stiffness, Muscle  Pain, Muscle Weakness and Swelling of Extremities. Neurological Not Present- Decreased Memory, Fainting, Headaches, Numbness, Seizures, Tingling,  Tremor, Trouble walking and Weakness. Psychiatric Not Present- Anxiety, Bipolar, Change in Sleep Pattern, Depression, Fearful and Frequent crying. Endocrine Not Present- Cold Intolerance, Excessive Hunger, Hair Changes, Heat Intolerance, Hot flashes and New Diabetes. Hematology Not Present- Blood Thinners, Easy Bruising, Excessive bleeding, Gland problems, HIV and Persistent Infections.  Vitals (Chemira Jones CMA; 05/04/2017 10:14 AM) 05/04/2017 10:13 AM Weight: 161.2 lb Height: 61in Body Surface Area: 1.72 m Body Mass Index: 30.46 kg/m  Temp.: 99.83F(Oral)  Pulse: 66 (Regular)  BP: 118/72 (Sitting, Left Arm, Standard)       Physical Exam (Evander Macaraeg A. Ninfa Linden Rivera; 05/04/2017 10:39 AM) General Mental Status-Alert. General Appearance-Consistent with stated age. Hydration-Well hydrated. Voice-Normal.  Head and Neck Head-normocephalic, atraumatic with no lesions or palpable masses.  Eye Eyeball - Bilateral-Extraocular movements intact. Sclera/Conjunctiva - Bilateral-No scleral icterus.  Chest and Lung Exam Chest and lung exam reveals -quiet, even and easy respiratory effort with no use of accessory muscles and on auscultation, normal breath sounds, no adventitious sounds and normal vocal resonance. Inspection Chest Wall - Normal. Back - normal.  Cardiovascular Cardiovascular examination reveals -on palpation PMI is normal in location and amplitude, no palpable S3 or S4. Normal cardiac borders., normal heart sounds, regular rate and rhythm with no murmurs, carotid auscultation reveals no bruits and normal pedal pulses bilaterally.  Abdomen Inspection Inspection of the abdomen reveals - No Hernias. Skin - Scar - no surgical scars. Palpation/Percussion Palpation and Percussion of the abdomen reveal - Soft, No  Rebound tenderness, No Rigidity (guarding) and No hepatosplenomegaly. Tenderness - Right Upper Quadrant. Auscultation Auscultation of the abdomen reveals - Bowel sounds normal.  Neurologic - Did not examine.  Musculoskeletal - Did not examine.    Assessment & Plan (Atianna Haidar A. Ninfa Linden Rivera; 05/04/2017 10:40 AM) CHRONIC CHOLECYSTITIS (K81.1) Impression: Given her symptoms, physical examination, pain during HIDA scan, and family history, I believe she has chronic cholecystitis. We discussed continued conservative management first proceeding with a laparoscopic cholecystectomy. She wishes to proceed with surgery. I discussed the risk of surgery which includes but is not limited to bleeding, infection, bile duct injury, bile leak, need to convert to an open procedure, cardiopulmonary issues, postoperative recovery, the chance this may not resolve any of her symptoms, etc. She understands and is eager proceed with surgery which will be scheduled

## 2017-05-25 NOTE — Anesthesia Preprocedure Evaluation (Addendum)
Anesthesia Evaluation  Patient identified by MRN, date of birth, ID band Patient awake    Reviewed: Allergy & Precautions, NPO status , Patient's Chart, lab work & pertinent test results  Airway Mallampati: II  TM Distance: >3 FB Neck ROM: Full    Dental  (+) Dental Advisory Given   Pulmonary Current Smoker,    breath sounds clear to auscultation       Cardiovascular negative cardio ROS   Rhythm:Regular Rate:Normal     Neuro/Psych  Headaches,    GI/Hepatic Neg liver ROS, GERD  ,  Endo/Other  negative endocrine ROS  Renal/GU Renal disease     Musculoskeletal   Abdominal   Peds  Hematology negative hematology ROS (+)   Anesthesia Other Findings   Reproductive/Obstetrics                            Lab Results  Component Value Date   WBC 6.0 05/21/2017   HGB 13.7 05/21/2017   HCT 40.6 05/21/2017   MCV 93.8 05/21/2017   PLT 252 05/21/2017   Lab Results  Component Value Date   CREATININE 0.87 05/21/2017   BUN 7 05/21/2017   NA 138 05/21/2017   K 4.0 05/21/2017   CL 106 05/21/2017   CO2 25 05/21/2017    Anesthesia Physical Anesthesia Plan  ASA: II  Anesthesia Plan: General   Post-op Pain Management:    Induction: Intravenous  Airway Management Planned: Oral ETT  Additional Equipment:   Intra-op Plan:   Post-operative Plan: Extubation in OR  Informed Consent: I have reviewed the patients History and Physical, chart, labs and discussed the procedure including the risks, benefits and alternatives for the proposed anesthesia with the patient or authorized representative who has indicated his/her understanding and acceptance.   Dental advisory given  Plan Discussed with: CRNA  Anesthesia Plan Comments:        Anesthesia Quick Evaluation

## 2017-05-26 ENCOUNTER — Ambulatory Visit (HOSPITAL_COMMUNITY): Payer: 59 | Admitting: Anesthesiology

## 2017-05-26 ENCOUNTER — Encounter (HOSPITAL_COMMUNITY)
Admission: RE | Disposition: A | Discharge status: Home / Self Care | Payer: Self-pay | Source: Ambulatory Visit | Attending: Surgery

## 2017-05-26 ENCOUNTER — Encounter (HOSPITAL_COMMUNITY): Payer: Self-pay

## 2017-05-26 ENCOUNTER — Ambulatory Visit (HOSPITAL_COMMUNITY)
Admission: RE | Admit: 2017-05-26 | Discharge: 2017-05-26 | Disposition: A | Discharge status: Home / Self Care | Payer: 59 | Source: Ambulatory Visit | Attending: Surgery | Admitting: Surgery

## 2017-05-26 DIAGNOSIS — Z885 Allergy status to narcotic agent status: Secondary | ICD-10-CM | POA: Diagnosis not present

## 2017-05-26 DIAGNOSIS — Z90711 Acquired absence of uterus with remaining cervical stump: Secondary | ICD-10-CM | POA: Insufficient documentation

## 2017-05-26 DIAGNOSIS — Z8041 Family history of malignant neoplasm of ovary: Secondary | ICD-10-CM | POA: Diagnosis not present

## 2017-05-26 DIAGNOSIS — Z8371 Family history of colonic polyps: Secondary | ICD-10-CM | POA: Insufficient documentation

## 2017-05-26 DIAGNOSIS — Z833 Family history of diabetes mellitus: Secondary | ICD-10-CM | POA: Diagnosis not present

## 2017-05-26 DIAGNOSIS — Z86010 Personal history of colonic polyps: Secondary | ICD-10-CM | POA: Insufficient documentation

## 2017-05-26 DIAGNOSIS — K811 Chronic cholecystitis: Secondary | ICD-10-CM | POA: Diagnosis not present

## 2017-05-26 DIAGNOSIS — Z79899 Other long term (current) drug therapy: Secondary | ICD-10-CM | POA: Diagnosis not present

## 2017-05-26 DIAGNOSIS — Z8249 Family history of ischemic heart disease and other diseases of the circulatory system: Secondary | ICD-10-CM | POA: Diagnosis not present

## 2017-05-26 DIAGNOSIS — F17200 Nicotine dependence, unspecified, uncomplicated: Secondary | ICD-10-CM | POA: Insufficient documentation

## 2017-05-26 DIAGNOSIS — Z823 Family history of stroke: Secondary | ICD-10-CM | POA: Insufficient documentation

## 2017-05-26 DIAGNOSIS — K219 Gastro-esophageal reflux disease without esophagitis: Secondary | ICD-10-CM | POA: Insufficient documentation

## 2017-05-26 DIAGNOSIS — Z8261 Family history of arthritis: Secondary | ICD-10-CM | POA: Diagnosis not present

## 2017-05-26 DIAGNOSIS — N200 Calculus of kidney: Secondary | ICD-10-CM | POA: Diagnosis not present

## 2017-05-26 DIAGNOSIS — K829 Disease of gallbladder, unspecified: Secondary | ICD-10-CM | POA: Diagnosis not present

## 2017-05-26 DIAGNOSIS — Z808 Family history of malignant neoplasm of other organs or systems: Secondary | ICD-10-CM | POA: Diagnosis not present

## 2017-05-26 DIAGNOSIS — K8010 Calculus of gallbladder with chronic cholecystitis without obstruction: Secondary | ICD-10-CM | POA: Diagnosis not present

## 2017-05-26 DIAGNOSIS — K819 Cholecystitis, unspecified: Secondary | ICD-10-CM | POA: Diagnosis not present

## 2017-05-26 DIAGNOSIS — G43909 Migraine, unspecified, not intractable, without status migrainosus: Secondary | ICD-10-CM | POA: Diagnosis not present

## 2017-05-26 HISTORY — PX: CHOLECYSTECTOMY: SHX55

## 2017-05-26 SURGERY — LAPAROSCOPIC CHOLECYSTECTOMY
Anesthesia: General | Site: Abdomen

## 2017-05-26 MED ORDER — ACETAMINOPHEN 650 MG RE SUPP: 650.0000 mg | RECTAL | Status: DC | PRN

## 2017-05-26 MED ORDER — KETOROLAC TROMETHAMINE 30 MG/ML IJ SOLN: 30.0000 mg | Freq: Once | INTRAMUSCULAR | Status: DC | PRN

## 2017-05-26 MED ORDER — ONDANSETRON HCL 4 MG/2ML IJ SOLN
INTRAMUSCULAR | Status: AC
  Filled 2017-05-26: qty 2

## 2017-05-26 MED ORDER — CHLORHEXIDINE GLUCONATE CLOTH 2 % EX PADS: 6.0000 | MEDICATED_PAD | Freq: Once | CUTANEOUS | Status: DC

## 2017-05-26 MED ORDER — LIDOCAINE 2% (20 MG/ML) 5 ML SYRINGE
INTRAMUSCULAR | Status: DC | PRN
  Administered 2017-05-26: 60 mg via INTRAVENOUS

## 2017-05-26 MED ORDER — PROPOFOL 10 MG/ML IV BOLUS
INTRAVENOUS | Status: AC
  Filled 2017-05-26: qty 20

## 2017-05-26 MED ORDER — DEXAMETHASONE SODIUM PHOSPHATE 10 MG/ML IJ SOLN
INTRAMUSCULAR | Status: DC | PRN
  Administered 2017-05-26: 10 mg via INTRAVENOUS

## 2017-05-26 MED ORDER — BUPIVACAINE HCL (PF) 0.25 % IJ SOLN
INTRAMUSCULAR | Status: DC | PRN
  Administered 2017-05-26: 15 mL

## 2017-05-26 MED ORDER — NEOSTIGMINE METHYLSULFATE 5 MG/5ML IV SOSY
PREFILLED_SYRINGE | INTRAVENOUS | Status: AC
  Filled 2017-05-26: qty 5

## 2017-05-26 MED ORDER — 0.9 % SODIUM CHLORIDE (POUR BTL) OPTIME
TOPICAL | Status: DC | PRN
  Administered 2017-05-26: 1000 mL

## 2017-05-26 MED ORDER — HYDROMORPHONE HCL 1 MG/ML IJ SOLN: 0.2500 mg | INTRAMUSCULAR | Status: DC | PRN

## 2017-05-26 MED ORDER — CEFAZOLIN SODIUM-DEXTROSE 2-4 GM/100ML-% IV SOLN
INTRAVENOUS | Status: AC
  Filled 2017-05-26: qty 100

## 2017-05-26 MED ORDER — GLYCOPYRROLATE 0.2 MG/ML IJ SOLN
INTRAMUSCULAR | Status: DC | PRN
  Administered 2017-05-26: 0.6 mg via INTRAVENOUS

## 2017-05-26 MED ORDER — SODIUM CHLORIDE 0.9 % IR SOLN
Status: DC | PRN
  Administered 2017-05-26: 1000 mL

## 2017-05-26 MED ORDER — ROCURONIUM BROMIDE 10 MG/ML (PF) SYRINGE
PREFILLED_SYRINGE | INTRAVENOUS | Status: AC
  Filled 2017-05-26: qty 5

## 2017-05-26 MED ORDER — TRAMADOL HCL 50 MG PO TABS: 50.0000 mg | ORAL_TABLET | Freq: Four times a day (QID) | ORAL | 0 refills | Status: DC | PRN

## 2017-05-26 MED ORDER — KETOROLAC TROMETHAMINE 30 MG/ML IJ SOLN
INTRAMUSCULAR | Status: DC | PRN
  Administered 2017-05-26: 30 mg via INTRAVENOUS

## 2017-05-26 MED ORDER — LACTATED RINGERS IV SOLN
INTRAVENOUS | Status: DC | PRN
  Administered 2017-05-26: 07:00:00 via INTRAVENOUS

## 2017-05-26 MED ORDER — EPHEDRINE 5 MG/ML INJ
INTRAVENOUS | Status: AC
  Filled 2017-05-26: qty 10

## 2017-05-26 MED ORDER — ROCURONIUM BROMIDE 10 MG/ML (PF) SYRINGE
PREFILLED_SYRINGE | INTRAVENOUS | Status: DC | PRN
  Administered 2017-05-26: 30 mg via INTRAVENOUS

## 2017-05-26 MED ORDER — BUPIVACAINE HCL (PF) 0.25 % IJ SOLN
INTRAMUSCULAR | Status: AC
  Filled 2017-05-26: qty 30

## 2017-05-26 MED ORDER — MIDAZOLAM HCL 2 MG/2ML IJ SOLN
INTRAMUSCULAR | Status: DC | PRN
Start: 2017-05-26 — End: 2017-05-26
  Administered 2017-05-26: 2 mg via INTRAVENOUS

## 2017-05-26 MED ORDER — PHENYLEPHRINE 40 MCG/ML (10ML) SYRINGE FOR IV PUSH (FOR BLOOD PRESSURE SUPPORT)
PREFILLED_SYRINGE | INTRAVENOUS | Status: AC
  Filled 2017-05-26: qty 10

## 2017-05-26 MED ORDER — LIDOCAINE 2% (20 MG/ML) 5 ML SYRINGE
INTRAMUSCULAR | Status: AC
  Filled 2017-05-26: qty 5

## 2017-05-26 MED ORDER — PROPOFOL 10 MG/ML IV BOLUS
INTRAVENOUS | Status: DC | PRN
  Administered 2017-05-26: 150 mg via INTRAVENOUS

## 2017-05-26 MED ORDER — NEOSTIGMINE METHYLSULFATE 10 MG/10ML IV SOLN
INTRAVENOUS | Status: DC | PRN
  Administered 2017-05-26: 4 mg via INTRAVENOUS

## 2017-05-26 MED ORDER — CEFAZOLIN SODIUM-DEXTROSE 2-4 GM/100ML-% IV SOLN
2.0000 g | INTRAVENOUS | Status: AC
  Administered 2017-05-26: 2 g via INTRAVENOUS

## 2017-05-26 MED ORDER — SUCCINYLCHOLINE CHLORIDE 20 MG/ML IJ SOLN
INTRAMUSCULAR | Status: DC | PRN
  Administered 2017-05-26: 100 mg via INTRAVENOUS

## 2017-05-26 MED ORDER — ONDANSETRON HCL 4 MG/2ML IJ SOLN
INTRAMUSCULAR | Status: DC | PRN
  Administered 2017-05-26: 4 mg via INTRAVENOUS

## 2017-05-26 MED ORDER — MIDAZOLAM HCL 2 MG/2ML IJ SOLN
INTRAMUSCULAR | Status: AC
Start: 2017-05-26 — End: 2017-05-26
  Filled 2017-05-26: qty 2

## 2017-05-26 MED ORDER — SUGAMMADEX SODIUM 200 MG/2ML IV SOLN
INTRAVENOUS | Status: AC
  Filled 2017-05-26: qty 2

## 2017-05-26 MED ORDER — FENTANYL CITRATE (PF) 250 MCG/5ML IJ SOLN
INTRAMUSCULAR | Status: AC
  Filled 2017-05-26: qty 5

## 2017-05-26 MED ORDER — CHLORHEXIDINE GLUCONATE CLOTH 2 % EX PADS
6.0000 | MEDICATED_PAD | Freq: Once | CUTANEOUS | Status: DC
Start: 2017-05-26 — End: 2017-05-26

## 2017-05-26 MED ORDER — ACETAMINOPHEN 325 MG PO TABS
650.0000 mg | ORAL_TABLET | ORAL | Status: DC | PRN
  Administered 2017-05-26: 650 mg via ORAL

## 2017-05-26 MED ORDER — SUCCINYLCHOLINE CHLORIDE 200 MG/10ML IV SOSY
PREFILLED_SYRINGE | INTRAVENOUS | Status: AC
  Filled 2017-05-26: qty 10

## 2017-05-26 MED ORDER — ACETAMINOPHEN 325 MG PO TABS
ORAL_TABLET | ORAL | Status: AC
  Filled 2017-05-26: qty 2

## 2017-05-26 MED ORDER — PROMETHAZINE HCL 25 MG/ML IJ SOLN: 6.2500 mg | INTRAMUSCULAR | Status: DC | PRN

## 2017-05-26 MED ORDER — FENTANYL CITRATE (PF) 250 MCG/5ML IJ SOLN
INTRAMUSCULAR | Status: DC | PRN
  Administered 2017-05-26: 100 ug via INTRAVENOUS

## 2017-05-26 SURGICAL SUPPLY — 35 items
APPLIER CLIP 5 13 M/L LIGAMAX5 (MISCELLANEOUS) ×3
CANISTER SUCT 3000ML PPV (MISCELLANEOUS) ×3 IMPLANT
CHLORAPREP W/TINT 26ML (MISCELLANEOUS) ×3 IMPLANT
CLIP APPLIE 5 13 M/L LIGAMAX5 (MISCELLANEOUS) ×1 IMPLANT
COVER SURGICAL LIGHT HANDLE (MISCELLANEOUS) ×3 IMPLANT
DECANTER SPIKE VIAL GLASS SM (MISCELLANEOUS) ×3 IMPLANT
DERMABOND ADVANCED (GAUZE/BANDAGES/DRESSINGS) ×2
DERMABOND ADVANCED .7 DNX12 (GAUZE/BANDAGES/DRESSINGS) ×1 IMPLANT
ELECT REM PT RETURN 9FT ADLT (ELECTROSURGICAL) ×3
ELECTRODE REM PT RTRN 9FT ADLT (ELECTROSURGICAL) ×1 IMPLANT
GLOVE BIOGEL PI IND STRL 7.0 (GLOVE) ×1 IMPLANT
GLOVE BIOGEL PI INDICATOR 7.0 (GLOVE) ×2
GLOVE ECLIPSE 6.0 STRL STRAW (GLOVE) ×3 IMPLANT
GLOVE ECLIPSE 6.5 STRL STRAW (GLOVE) ×3 IMPLANT
GLOVE SURG SIGNA 7.5 PF LTX (GLOVE) ×3 IMPLANT
GOWN STRL REUS W/ TWL LRG LVL3 (GOWN DISPOSABLE) ×2 IMPLANT
GOWN STRL REUS W/ TWL XL LVL3 (GOWN DISPOSABLE) ×1 IMPLANT
GOWN STRL REUS W/TWL LRG LVL3 (GOWN DISPOSABLE) ×4
GOWN STRL REUS W/TWL XL LVL3 (GOWN DISPOSABLE) ×2
KIT BASIN OR (CUSTOM PROCEDURE TRAY) ×3 IMPLANT
KIT ROOM TURNOVER OR (KITS) ×3 IMPLANT
NS IRRIG 1000ML POUR BTL (IV SOLUTION) ×3 IMPLANT
PAD ARMBOARD 7.5X6 YLW CONV (MISCELLANEOUS) ×3 IMPLANT
POUCH SPECIMEN RETRIEVAL 10MM (ENDOMECHANICALS) ×3 IMPLANT
SCISSORS LAP 5X35 DISP (ENDOMECHANICALS) ×3 IMPLANT
SET IRRIG TUBING LAPAROSCOPIC (IRRIGATION / IRRIGATOR) ×3 IMPLANT
SLEEVE ENDOPATH XCEL 5M (ENDOMECHANICALS) ×6 IMPLANT
SPECIMEN JAR SMALL (MISCELLANEOUS) ×3 IMPLANT
SUT MNCRL AB 4-0 PS2 18 (SUTURE) ×3 IMPLANT
TOWEL OR 17X24 6PK STRL BLUE (TOWEL DISPOSABLE) ×3 IMPLANT
TOWEL OR 17X26 10 PK STRL BLUE (TOWEL DISPOSABLE) IMPLANT
TRAY LAPAROSCOPIC MC (CUSTOM PROCEDURE TRAY) ×3 IMPLANT
TROCAR XCEL BLUNT TIP 100MML (ENDOMECHANICALS) ×3 IMPLANT
TROCAR XCEL NON-BLD 5MMX100MML (ENDOMECHANICALS) ×3 IMPLANT
TUBING INSUFFLATION (TUBING) ×3 IMPLANT

## 2017-05-26 NOTE — Interval H&P Note (Signed)
History and Physical Interval Note: no change in H and P  05/26/2017 6:51 AM  [name]  has presented today for surgery, with the diagnosis of chronic cholecystitis  The various methods of treatment have been discussed with the patient and family. After consideration of risks, benefits and other options for treatment, the patient has consented to  Procedure(s): LAPAROSCOPIC CHOLECYSTECTOMY (N/A) as a surgical intervention .  The patient's history has been reviewed, patient examined, no change in status, stable for surgery.  I have reviewed the patient's chart and labs.  Questions were answered to the patient's satisfaction.     Dmetrius Ambs A

## 2017-05-26 NOTE — Anesthesia Procedure Notes (Signed)
Procedure Name: Intubation Date/Time: 05/26/2017 7:48 AM Performed by: Julieta Bellini Pre-anesthesia Checklist: Patient identified, Emergency Drugs available, Suction available and Patient being monitored Patient Re-evaluated:Patient Re-evaluated prior to inductionOxygen Delivery Method: Circle system utilized Preoxygenation: Pre-oxygenation with 100% oxygen Intubation Type: IV induction Ventilation: Mask ventilation without difficulty Laryngoscope Size: Mac and 3 Grade View: Grade II Tube type: Oral Tube size: 7.0 mm Number of attempts: 1 Airway Equipment and Method: Stylet Placement Confirmation: ETT inserted through vocal cords under direct vision,  positive ETCO2 and breath sounds checked- equal and bilateral Secured at: 23 cm Tube secured with: Tape Dental Injury: Teeth and Oropharynx as per pre-operative assessment

## 2017-05-26 NOTE — Discharge Instructions (Signed)
[facility] ______[facility] LAPAROSCOPIC SURGERY: POST OP INSTRUCTIONS Always review your discharge instruction sheet given to you by the facility where your surgery was performed. IF YOU HAVE DISABILITY OR FAMILY LEAVE FORMS, YOU MUST BRING THEM TO THE OFFICE FOR PROCESSING.   DO NOT GIVE THEM TO YOUR DOCTOR.  1. A prescription for pain medication may be given to you upon discharge.  Take your pain medication as prescribed, if needed.  If narcotic pain medicine is not needed, then you may take acetaminophen (Tylenol) or ibuprofen (Advil) as needed. 2. Take your usually prescribed medications unless otherwise directed. 3. If you need a refill on your pain medication, please contact your pharmacy.  They will contact our office to request authorization. Prescriptions will not be filled after 5pm or on week-ends. 4. You should follow a light diet the first few days after arrival home, such as soup and crackers, etc.  Be sure to include lots of fluids daily. 5. Most patients will experience some swelling and bruising in the area of the incisions.  Ice packs will help.  Swelling and bruising can take several days to resolve.  6. It is common to experience some constipation if taking pain medication after surgery.  Increasing fluid intake and taking a stool softener (such as Colace) will usually help or prevent this problem from occurring.  A mild laxative (Milk of Magnesia or Miralax) should be taken according to package instructions if there are no bowel movements after 48 hours. 7. Unless discharge instructions indicate otherwise, you may remove your bandages 24-48 hours after surgery, and you may shower at that time.  You may have steri-strips (small skin tapes) in place directly over the incision.  These strips should be left on the skin for 7-10 days.  If your surgeon used skin glue on the incision, you may shower in 24 hours.  The glue will flake off over the next 2-3 weeks.  Any sutures or  staples will be removed at the office during your follow-up visit. 8. ACTIVITIES:  You may resume regular (light) daily activities beginning the next day--such as daily self-care, walking, climbing stairs--gradually increasing activities as tolerated.  You may have sexual intercourse when it is comfortable.  Refrain from any heavy lifting or straining until approved by your doctor. a. You may drive when you are no longer taking prescription pain medication, you can comfortably wear a seatbelt, and you can safely maneuver your car and apply brakes. b. RETURN TO WORK:  __________________________________________________________ 9. You should see your doctor in the office for a follow-up appointment approximately 2-3 weeks after your surgery.  Make sure that you call for this appointment within a day or two after you arrive home to insure a convenient appointment time. 10. OTHER INSTRUCTIONS:OK TO SHOWER TOMORROW 11. NO LIFTING MORE THAN 15 POUNDS FOR 2 WEEKS 12. TYLENOL, IBUPROFEN, ICE PACK ALSO FOR PAIN __________________________________________________________________________________________________________________________ __________________________________________________________________________________________________________________________ WHEN TO CALL YOUR DOCTOR: 1. Fever over 101.0 2. Inability to urinate 3. Continued bleeding from incision. 4. Increased pain, redness, or drainage from the incision. 5. Increasing abdominal pain  The clinic staff is available to answer your questions during regular business hours.  Please dont hesitate to call and ask to speak to one of the nurses for clinical concerns.  If you have a medical emergency, go to the nearest emergency room or call 911.  A surgeon from [facility] is always on call at the hospital. [address], [place], [place], [place]  [address] ? [address], [place], [place]   [phone] ? [phone] ? FAX  [phone] Web site: [email or web address]

## 2017-05-26 NOTE — Progress Notes (Signed)
Report given to ronda hunt rn as caregiver 

## 2017-05-26 NOTE — Transfer of Care (Signed)
Immediate Anesthesia Transfer of Care Note  Patient: [name]  Procedure(s) Performed: Procedure(s): LAPAROSCOPIC CHOLECYSTECTOMY (N/A)  Patient Location: PACU  Anesthesia Type:General  Level of Consciousness: awake, alert , oriented and patient cooperative  Airway & Oxygen Therapy: Patient Spontanous Breathing and Patient connected to nasal cannula oxygen  Post-op Assessment: Report given to RN, Post -op Vital signs reviewed and stable and Patient moving all extremities X 4  Post vital signs: Reviewed and stable  Last Vitals:  Vitals:   05/26/17 0624  BP: (!) 100/56  Pulse: (!) 59  Resp: 16  Temp: 36.8 C    Last Pain:  Vitals:   05/26/17 0624  TempSrc: Oral         Complications: No apparent anesthesia complications

## 2017-05-26 NOTE — Anesthesia Postprocedure Evaluation (Signed)
Anesthesia Post Note  Patient: [name]  Procedure(s) Performed: Procedure(s) (LRB): LAPAROSCOPIC CHOLECYSTECTOMY (N/A)  Patient location during evaluation: PACU Anesthesia Type: General Level of consciousness: awake and alert Pain management: pain level controlled Vital Signs Assessment: post-procedure vital signs reviewed and stable Respiratory status: spontaneous breathing, nonlabored ventilation, respiratory function stable and patient connected to nasal cannula oxygen Cardiovascular status: blood pressure returned to baseline and stable Postop Assessment: no signs of nausea or vomiting Anesthetic complications: no       Last Vitals:  Vitals:   05/26/17 0859 05/26/17 0900  BP:  112/61  Pulse: (!) 57 (!) 56  Resp: 15 18  Temp: 36.1 C     Last Pain:  Vitals:   05/26/17 0820  TempSrc:   PainSc: 0-No pain                 Tiajuana Amass

## 2017-05-26 NOTE — Op Note (Signed)

## 2017-05-27 ENCOUNTER — Encounter (HOSPITAL_COMMUNITY): Payer: Self-pay | Admitting: Surgery

## 2017-06-28 MED FILL — OMEPRAZOLE DR 40 MG CAPSULE: 40 | 30 days supply | Qty: 30 | Fill #4

## 2017-07-09 ENCOUNTER — Telehealth: Payer: 59 | Admitting: Family

## 2017-07-09 DIAGNOSIS — B9689 Other specified bacterial agents as the cause of diseases classified elsewhere: Secondary | ICD-10-CM

## 2017-07-09 DIAGNOSIS — J329 Chronic sinusitis, unspecified: Secondary | ICD-10-CM | POA: Diagnosis not present

## 2017-07-09 MED ORDER — AMOXICILLIN-POT CLAVULANATE 875-125 MG PO TABS: 1.0000 | ORAL_TABLET | Freq: Two times a day (BID) | ORAL | 0 refills | Status: AC

## 2017-07-09 MED FILL — AMOX-CLAV 875-125 MG TABLET: 875-125 | 7 days supply | Qty: 14 | Fill #0

## 2017-07-09 NOTE — Progress Notes (Signed)
Thank you for the details you put in the comment boxes. Those details really help us take better care of you.   We are sorry that you are not feeling well.  Here is how we plan to help!  Based on what you have shared with me it looks like you have sinusitis.  Sinusitis is inflammation and infection in the sinus cavities of the head.  Based on your presentation I believe you most likely have Acute Bacterial Sinusitis.  This is an infection caused by bacteria and is treated with antibiotics. I have prescribed Augmentin 875mg/125mg one tablet twice daily with food, for 7 days. You may use an oral decongestant such as Mucinex D or if you have glaucoma or high blood pressure use plain Mucinex. Saline nasal spray help and can safely be used as often as needed for congestion.  If you develop worsening sinus pain, fever or notice severe headache and vision changes, or if symptoms are not better after completion of antibiotic, please schedule an appointment with a health care provider.    Sinus infections are not as easily transmitted as other respiratory infection, however we still recommend that you avoid close contact with loved ones, especially the very young and elderly.  Remember to wash your hands thoroughly throughout the day as this is the number one way to prevent the spread of infection!  Home Care:  Only take medications as instructed by your medical team.  Complete the entire course of an antibiotic.  Do not take these medications with alcohol.  A steam or ultrasonic humidifier can help congestion.  You can place a towel over your head and breathe in the steam from hot water coming from a faucet.  Avoid close contacts especially the very young and the elderly.  Cover your mouth when you cough or sneeze.  Always remember to wash your hands.  Get Help Right Away If:  You develop worsening fever or sinus pain.  You develop a severe head ache or visual changes.  Your symptoms persist  after you have completed your treatment plan.  Make sure you  Understand these instructions.  Will watch your condition.  Will get help right away if you are not doing well or get worse.  Your e-visit answers were reviewed by a board certified advanced clinical practitioner to complete your personal care plan.  Depending on the condition, your plan could have included both over the counter or prescription medications.  If there is a problem please reply  once you have received a response from your provider.  Your safety is important to us.  If you have drug allergies check your prescription carefully.    You can use MyChart to ask questions about today's visit, request a non-urgent call back, or ask for a work or school excuse for 24 hours related to this e-Visit. If it has been greater than 24 hours you will need to follow up with your provider, or enter a new e-Visit to address those concerns.  You will get an e-mail in the next two days asking about your experience.  I hope that your e-visit has been valuable and will speed your recovery. Thank you for using e-visits.    

## 2017-07-23 ENCOUNTER — Telehealth: Payer: 59 | Admitting: Family

## 2017-07-23 DIAGNOSIS — B9789 Other viral agents as the cause of diseases classified elsewhere: Secondary | ICD-10-CM

## 2017-07-23 DIAGNOSIS — J329 Chronic sinusitis, unspecified: Secondary | ICD-10-CM

## 2017-07-23 MED ORDER — PREDNISONE 10 MG (21) PO TBPK: ORAL_TABLET | ORAL | 0 refills | Status: DC

## 2017-07-23 MED FILL — predniSONE 10 MG TABS: 10 | 6 days supply | Qty: 21 | Fill #0

## 2017-07-23 NOTE — Progress Notes (Signed)
We are sorry that you are not feeling well.  Here is how we plan to help!  Based on what you have shared with me it looks like you have sinusitis.  Sinusitis is inflammation and infection in the sinus cavities of the head.  Based on your presentation I believe you most likely have Acute Viral Sinusitis.This is an infection most likely caused by a virus. There is not specific treatment for viral sinusitis other than to help you with the symptoms until the infection runs its course.  You may use an oral decongestant such as Mucinex D or if you have glaucoma or high blood pressure use plain Mucinex. Saline nasal spray help and can safely be used as often as needed for congestion, I have prescribed: Medrol dosepak as directed. You do not need more antibiotics. This medication will help with pressure and sinus congestion.  Some authorities believe that zinc sprays or the use of Echinacea may shorten the course of your symptoms.  Sinus infections are not as easily transmitted as other respiratory infection, however we still recommend that you avoid close contact with loved ones, especially the very young and elderly.  Remember to wash your hands thoroughly throughout the day as this is the number one way to prevent the spread of infection!  Home Care:  Only take medications as instructed by your medical team.  Complete the entire course of an antibiotic.  Do not take these medications with alcohol.  A steam or ultrasonic humidifier can help congestion.  You can place a towel over your head and breathe in the steam from hot water coming from a faucet.  Avoid close contacts especially the very young and the elderly.  Cover your mouth when you cough or sneeze.  Always remember to wash your hands.  Get Help Right Away If:  You develop worsening fever or sinus pain.  You develop a severe head ache or visual changes.  Your symptoms persist after you have completed your treatment plan.  Make sure  you  Understand these instructions.  Will watch your condition.  Will get help right away if you are not doing well or get worse.  Your e-visit answers were reviewed by a board certified advanced clinical practitioner to complete your personal care plan.  Depending on the condition, your plan could have included both over the counter or prescription medications.  If there is a problem please reply  once you have received a response from your provider.  Your safety is important to [place].  If you have drug allergies check your prescription carefully.    You can use MyChart to ask questions about today's visit, request a non-urgent call back, or ask for a work or school excuse for 24 hours related to this e-Visit. If it has been greater than 24 hours you will need to follow up with your provider, or enter a new e-Visit to address those concerns.  You will get an e-mail in the next two days asking about your experience.  I hope that your e-visit has been valuable and will speed your recovery. Thank you for using e-visits.

## 2017-11-25 DIAGNOSIS — D224 Melanocytic nevi of scalp and neck: Secondary | ICD-10-CM | POA: Diagnosis not present

## 2017-11-25 DIAGNOSIS — D485 Neoplasm of uncertain behavior of skin: Secondary | ICD-10-CM | POA: Diagnosis not present

## 2017-11-25 DIAGNOSIS — D2239 Melanocytic nevi of other parts of face: Secondary | ICD-10-CM | POA: Diagnosis not present

## 2017-11-25 DIAGNOSIS — Z23 Encounter for immunization: Secondary | ICD-10-CM | POA: Diagnosis not present

## 2017-11-25 DIAGNOSIS — D1801 Hemangioma of skin and subcutaneous tissue: Secondary | ICD-10-CM | POA: Diagnosis not present

## 2017-11-29 ENCOUNTER — Other Ambulatory Visit: Payer: Self-pay | Admitting: Medical

## 2017-12-03 MED FILL — valACYclovir HCL 1 GM TABS: 1 | 7 days supply | Qty: 14 | Fill #0

## 2017-12-03 NOTE — Telephone Encounter (Signed)
I refilled patient's Valtrex.  Please notify her.

## 2017-12-03 NOTE — Telephone Encounter (Signed)
Pt is requesting Valtrex. Pt last appointment  02/24/17

## 2017-12-09 DIAGNOSIS — D224 Melanocytic nevi of scalp and neck: Secondary | ICD-10-CM | POA: Diagnosis not present

## 2017-12-09 DIAGNOSIS — D485 Neoplasm of uncertain behavior of skin: Secondary | ICD-10-CM | POA: Diagnosis not present

## 2018-01-03 ENCOUNTER — Ambulatory Visit (INDEPENDENT_AMBULATORY_CARE_PROVIDER_SITE_OTHER): Payer: Self-pay | Admitting: Nurse Practitioner

## 2018-01-03 VITALS — BP 118/74 | HR 82 | Temp 99.6°F | Resp 17 | Wt 167.2 lb

## 2018-01-03 DIAGNOSIS — L259 Unspecified contact dermatitis, unspecified cause: Secondary | ICD-10-CM

## 2018-01-03 MED ORDER — PREDNISONE 20 MG PO TABS
20.0000 mg | ORAL_TABLET | Freq: Every day | ORAL | 0 refills | Status: DC
Start: 2018-01-03 — End: 2018-02-22

## 2018-01-03 MED ORDER — TRIAMCINOLONE ACETONIDE 0.1 % EX CREA: 1.0000 "application " | TOPICAL_CREAM | Freq: Two times a day (BID) | CUTANEOUS | 0 refills | Status: AC

## 2018-01-03 MED FILL — predniSONE 20 MG TABS: 20 | 3 days supply | Qty: 3 | Fill #0

## 2018-01-03 MED FILL — TRIAMCINOLONE 0.1% CREAM: 0.1 | 7 days supply | Qty: 30 | Fill #0

## 2018-01-03 NOTE — Progress Notes (Signed)
Subjective:     [name] is a 47 y.o. female who presents for evaluation of a rash involving the face. Rash started 2 days ago. Lesions are red, and flat in texture. Rash has changed over time. Rash is pruritic. Associated symptoms: none. Patient denies: abdominal pain, congestion, cough, fever, headache, nausea and sore throat. Patient has had contacts with similar rash and a history of eczema. Patient has had new exposures (soaps, lotions, laundry detergents, foods, medications, plants, insects or animals). Patient states she tried a new e-cigarette with a menthol flavor and a new filter.  Patient also states she received a new cloth for her skin care which could also be a likely cause.  The following portions of the patient's history were reviewed and updated as appropriate: allergies, current medications and past medical history.  Review of Systems Constitutional: negative Eyes: negative Ears, nose, mouth, throat, and face: negative Respiratory: negative Cardiovascular: negative Integument/breast: positive for rash Behavioral/Psych: negative    Objective:    There were no vitals taken for this visit. General:  alert, cooperative and no distress  Skin:  rash noted on face and under eyes and bilateral eyelids. Rash is flat, erythematous with some dry flaking skin.  Mild periorbital swelling.     Assessment:    contact dermatitis: Unknown agent    Plan:    Medications: triamcinolone and Prednisone 20mg  for 3 days.. Written patient instruction given. Follow up in as needed when needed.Marland Kitchen

## 2018-01-03 NOTE — Patient Instructions (Addendum)

## 2018-01-05 ENCOUNTER — Telehealth: Payer: Self-pay | Admitting: Nurse Practitioner

## 2018-01-05 NOTE — Telephone Encounter (Signed)
Called patient to follow up on her status.  Reached voicemail, left message asking patient to return my call.

## 2018-01-24 DIAGNOSIS — H524 Presbyopia: Secondary | ICD-10-CM | POA: Diagnosis not present

## 2018-01-24 DIAGNOSIS — H5213 Myopia, bilateral: Secondary | ICD-10-CM | POA: Diagnosis not present

## 2018-01-24 DIAGNOSIS — H52223 Regular astigmatism, bilateral: Secondary | ICD-10-CM | POA: Diagnosis not present

## 2018-02-02 MED FILL — valACYclovir HCL 1 GM TABS: 1 | 7 days supply | Qty: 14 | Fill #1

## 2018-02-22 ENCOUNTER — Ambulatory Visit (INDEPENDENT_AMBULATORY_CARE_PROVIDER_SITE_OTHER): Payer: Self-pay | Admitting: Emergency Medicine

## 2018-02-22 VITALS — BP 115/75 | HR 75 | Temp 98.2°F | Resp 16 | Wt 165.0 lb

## 2018-02-22 DIAGNOSIS — J302 Other seasonal allergic rhinitis: Secondary | ICD-10-CM

## 2018-02-22 DIAGNOSIS — H9203 Otalgia, bilateral: Secondary | ICD-10-CM

## 2018-02-22 MED ORDER — PREDNISONE 50 MG PO TABS: ORAL_TABLET | ORAL | 0 refills | Status: DC

## 2018-02-22 MED FILL — predniSONE 50 MG TABS: 50 | 5 days supply | Qty: 5 | Fill #0

## 2018-02-22 NOTE — Progress Notes (Signed)
S: [name] is a 47 y.o. female who presents for congestion and otalgia ongoing for one week, no fever, chills, n/v/d, tinnitus, does have history of allergies, reports they are aggravated by mold and rain. She does smoke, no hx of asthma or other chronic lung diseases.  Review of Systems  Constitutional: Negative for chills and fever.  HENT: Positive for congestion and ear pain. Negative for hearing loss, sinus pain, sore throat and tinnitus.   Respiratory: Negative.   Cardiovascular: Negative.   Musculoskeletal: Negative.   Neurological: Negative.    O: Vitals:   02/22/18 1649  BP: 115/75  Pulse: 75  Resp: 16  Temp: 98.2 F (36.8 C)  SpO2: 96%   Physical Exam  Constitutional: She appears well-developed and well-nourished. No distress.  HENT:  Head: Normocephalic and atraumatic.  Right Ear: External ear normal.  Left Ear: External ear normal.  Nose: Nose normal.  Mouth/Throat: Oropharynx is clear and moist. No oropharyngeal exudate.  Bilateral mid ear effusion without erythema, bulging, or purlence   Eyes: Conjunctivae are normal.  Neck: Normal range of motion.  Cardiovascular: Normal rate and regular rhythm.  Pulmonary/Chest: Effort normal and breath sounds normal.  Neurological: She is alert.  Skin: Skin is warm and dry. Capillary refill takes less than 2 seconds. She is not diaphoretic.  Nursing note and vitals reviewed.    A: 1. Otalgia of both ears   2. Seasonal allergic rhinitis, unspecified trigger     P: 1. Otalgia of both ears -tylenol, motrin, Zyrtec - predniSONE (DELTASONE) 50 MG tablet; Take 1 tablet daily with food  Dispense: 5 tablet; Refill: 0  2. Seasonal allergic rhinitis, unspecified trigger -Zyrtec daily, OTC flonase starting after prednisone - predniSONE (DELTASONE) 50 MG tablet; Take 1 tablet daily with food  Dispense: 5 tablet; Refill: 0

## 2018-02-22 NOTE — Patient Instructions (Signed)
Earache, Adult An earache, or ear pain, can be caused by many things, including:  An infection.  Ear wax buildup.  Ear pressure.  Something in the ear that should not be there (foreign body).  A sore throat.  Tooth problems.  Jaw problems.  Treatment of the earache will depend on the cause. If the cause is not clear or cannot be determined, you may need to watch your symptoms until your earache goes away or until a cause is found. Follow these instructions at home: Pay attention to any changes in your symptoms. Take these actions to help with your pain:  Take or apply over-the-counter and prescription medicines only as told by your health care provider.  If you were prescribed an antibiotic medicine, use it as told by your health care provider. Do not stop using the antibiotic even if you start to feel better.  Do not put anything in your ear other than medicine that is prescribed by your health care provider.  If directed, apply heat to the affected area as often as told by your health care provider. Use the heat source that your health care provider recommends, such as a moist heat pack or a heating pad. ? Place a towel between your skin and the heat source. ? Leave the heat on for 20-30 minutes. ? Remove the heat if your skin turns bright red. This is especially important if you are unable to feel pain, heat, or cold. You may have a greater risk of getting burned.  If directed, put ice on the ear: ? Put ice in a plastic bag. ? Place a towel between your skin and the bag. ? Leave the ice on for 20 minutes, 2-3 times a day.  Try resting in an upright position instead of lying down. This may help to reduce pressure in your ear and relieve pain.  Chew gum if it helps to relieve your ear pain.  Treat any allergies as told by your health care provider.  Keep all follow-up visits as told by your health care provider. This is important.  Contact a health care provider  if:  Your pain does not improve within 2 days.  Your earache gets worse.  You have new symptoms.  You have a fever. Get help right away if:  You have a severe headache.  You have a stiff neck.  You have trouble swallowing.  You have redness or swelling behind your ear.  You have fluid or blood coming from your ear.  You have hearing loss.  You feel dizzy. This information is not intended to replace advice given to you by your health care provider. Make sure you discuss any questions you have with your health care provider. Document Released: 07/31/2004 Document Revised: 08/11/2016 Document Reviewed: 06/08/2016 Elsevier Interactive Patient Education  2018 [employer]. Allergic Rhinitis, Adult Allergic rhinitis is an allergic reaction that affects the mucous membrane inside the nose. It causes sneezing, a runny or stuffy nose, and the feeling of mucus going down the back of the throat (postnasal drip). Allergic rhinitis can be mild to severe. There are two types of allergic rhinitis:  Seasonal. This type is also called hay fever. It happens only during certain seasons.  Perennial. This type can happen at any time of the year.  What are the causes? This condition happens when the body's defense system (immune system) responds to certain harmless substances called allergens as though they were germs.  Seasonal allergic rhinitis is triggered by pollen,  which can come from grasses, trees, and weeds. Perennial allergic rhinitis may be caused by:  House dust mites.  Pet dander.  Mold spores.  What are the signs or symptoms? Symptoms of this condition include:  Sneezing.  Runny or stuffy nose (nasal congestion).  Postnasal drip.  Itchy nose.  Tearing of the eyes.  Trouble sleeping.  Daytime sleepiness.  How is this diagnosed? This condition may be diagnosed based on:  Your medical history.  A physical exam.  Tests to check for related conditions, such  as: ? Asthma. ? Pink eye. ? Ear infection. ? Upper respiratory infection.  Tests to find out which allergens trigger your symptoms. These may include skin or blood tests.  How is this treated? There is no cure for this condition, but treatment can help control symptoms. Treatment may include:  Taking medicines that block allergy symptoms, such as antihistamines. Medicine may be given as a shot, nasal spray, or pill.  Avoiding the allergen.  Desensitization. This treatment involves getting ongoing shots until your body becomes less sensitive to the allergen. This treatment may be done if other treatments do not help.  If taking medicine and avoiding the allergen does not work, new, stronger medicines may be prescribed.  Follow these instructions at home:  Find out what you are allergic to. Common allergens include smoke, dust, and pollen.  Avoid the things you are allergic to. These are some things you can do to help avoid allergens: ? Replace carpet with wood, tile, or vinyl flooring. Carpet can trap dander and dust. ? Do not smoke. Do not allow smoking in your home. ? Change your heating and air conditioning filter at least once a month. ? During allergy season:  Keep windows closed as much as possible.  Plan outdoor activities when pollen counts are lowest. This is usually during the evening hours.  When coming indoors, change clothing and shower before sitting on furniture or bedding.  Take over-the-counter and prescription medicines only as told by your health care provider.  Keep all follow-up visits as told by your health care provider. This is important. Contact a health care provider if:  You have a fever.  You develop a persistent cough.  You make whistling sounds when you breathe (you wheeze).  Your symptoms interfere with your normal daily activities. Get help right away if:  You have shortness of breath. Summary  This condition can be managed by taking  medicines as directed and avoiding allergens.  Contact your health care provider if you develop a persistent cough or fever.  During allergy season, keep windows closed as much as possible. This information is not intended to replace advice given to you by your health care provider. Make sure you discuss any questions you have with your health care provider. Document Released: 09/08/2001 Document Revised: 01/21/2017 Document Reviewed: 01/21/2017 Elsevier Interactive Patient Education  [employer].

## 2018-03-09 ENCOUNTER — Ambulatory Visit (INDEPENDENT_AMBULATORY_CARE_PROVIDER_SITE_OTHER): Payer: Self-pay | Admitting: Emergency Medicine

## 2018-03-09 ENCOUNTER — Encounter: Payer: Self-pay | Admitting: Emergency Medicine

## 2018-03-09 VITALS — BP 124/80 | HR 73 | Resp 16

## 2018-03-09 DIAGNOSIS — Z716 Tobacco abuse counseling: Secondary | ICD-10-CM

## 2018-03-09 MED ORDER — BUPROPION HCL ER (SR) 150 MG PO TB12: 150.0000 mg | ORAL_TABLET | Freq: Two times a day (BID) | ORAL | 5 refills | Status: DC

## 2018-03-09 MED FILL — BUPROPION SR 150 MG TABLET: 150 | 30 days supply | Qty: 60 | Fill #0

## 2018-03-09 NOTE — Patient Instructions (Addendum)
Coping with Quitting Smoking 1 tablet once a day for 3 days, then one tablet twice a day for the rest of the week. Pick a quit date 1-2 weeks from today then complete cessation of smoking. May use nicotine containing gum as needed, no longer than one month. After 12 weeks you may consider tapering off the Wellbutrin, or continuing for for another 12 weeks. When you are ready to stop Wellbutrin, decrease to 1 tablet once a day for one week, then you may stop.   Quitting smoking is a physical and mental challenge. You will face cravings, withdrawal symptoms, and temptation. Before quitting, work with your health care provider to make a plan that can help you cope. Preparation can help you quit and keep you from giving in. How can I cope with cravings? Cravings usually last for 5-10 minutes. If you get through it, the craving will pass. Consider taking the following actions to help you cope with cravings:  Keep your mouth busy: ? Chew sugar-free gum. ? Suck on hard candies or a straw. ? Brush your teeth.  Keep your hands and body busy: ? Immediately change to a different activity when you feel a craving. ? Squeeze or play with a ball. ? Do an activity or a hobby, like making bead jewelry, practicing needlepoint, or working with wood. ? Mix up your normal routine. ? Take a short exercise break. Go for a quick walk or run up and down stairs. ? Spend time in public places where smoking is not allowed.  Focus on doing something kind or helpful for someone else.  Call a friend or family member to talk during a craving.  Join a support group.  Call a quit line, such as 1-800-QUIT-NOW.  Talk with your health care provider about medicines that might help you cope with cravings and make quitting easier for you.  How can I deal with withdrawal symptoms? Your body may experience negative effects as it tries to get used to not having nicotine in the system. These effects are called withdrawal  symptoms. They may include:  Feeling hungrier than normal.  Trouble concentrating.  Irritability.  Trouble sleeping.  Feeling depressed.  Restlessness and agitation.  Craving a cigarette.  To manage withdrawal symptoms:  Avoid places, people, and activities that trigger your cravings.  Remember why you want to quit.  Get plenty of sleep.  Avoid coffee and other caffeinated drinks. These may worsen some of your symptoms.  How can I handle social situations? Social situations can be difficult when you are quitting smoking, especially in the first few weeks. To manage this, you can:  Avoid parties, bars, and other social situations where people might be smoking.  Avoid alcohol.  Leave right away if you have the urge to smoke.  Explain to your family and friends that you are quitting smoking. Ask for understanding and support.  Plan activities with friends or family where smoking is not an option.  What are some ways I can cope with stress? Wanting to smoke may cause stress, and stress can make you want to smoke. Find ways to manage your stress. Relaxation techniques can help. For example:  Breathe slowly and deeply, in through your nose and out through your mouth.  Listen to soothing, relaxing music.  Talk with a family member or friend about your stress.  Light a candle.  Soak in a bath or take a shower.  Think about a peaceful place.  What are some ways I  can prevent weight gain? Be aware that many people gain weight after they quit smoking. However, not everyone does. To keep from gaining weight, have a plan in place before you quit and stick to the plan after you quit. Your plan should include:  Having healthy snacks. When you have a craving, it may help to: ? Eat plain popcorn, crunchy carrots, celery, or other cut vegetables. ? Chew sugar-free gum.  Changing how you eat: ? Eat small portion sizes at meals. ? Eat 4-6 small meals throughout the day  instead of 1-2 large meals a day. ? Be mindful when you eat. Do not watch television or do other things that might distract you as you eat.  Exercising regularly: ? Make time to exercise each day. If you do not have time for a long workout, do short bouts of exercise for 5-10 minutes several times a day. ? Do some form of strengthening exercise, like weight lifting, and some form of aerobic exercise, like running or swimming.  Drinking plenty of water or other low-calorie or no-calorie drinks. Drink 6-8 glasses of water daily, or as much as instructed by your health care provider.  Summary  Quitting smoking is a physical and mental challenge. You will face cravings, withdrawal symptoms, and temptation to smoke again. Preparation can help you as you go through these challenges.  You can cope with cravings by keeping your mouth busy (such as by chewing gum), keeping your body and hands busy, and making calls to family, friends, or a helpline for people who want to quit smoking.  You can cope with withdrawal symptoms by avoiding places where people smoke, avoiding drinks with caffeine, and getting plenty of rest.  Ask your health care provider about the different ways to prevent weight gain, avoid stress, and handle social situations. This information is not intended to replace advice given to you by your health care provider. Make sure you discuss any questions you have with your health care provider. Document Released: 12/11/2016 Document Revised: 12/11/2016 Document Reviewed: 12/11/2016 Elsevier Interactive Patient Education  2018 [employer]. Bupropion sustained-release tablets (smoking cessation) What is this medicine? BUPROPION (byoo PROE pee on) is used to help people quit smoking. This medicine may be used for other purposes; ask your health care provider or pharmacist if you have questions. COMMON BRAND NAME(S): Buproban, Zyban What should I tell my health care provider before I  take this medicine? They need to know if you have any of these conditions: -an eating disorder, such as anorexia or bulimia -bipolar disorder or psychosis -diabetes or high blood sugar, treated with medication -glaucoma -head injury or brain tumor -heart disease, previous heart attack, or irregular heart beat -high blood pressure -kidney or liver disease -seizures -suicidal thoughts or a previous suicide attempt -Tourette's syndrome -weight loss -an unusual or allergic reaction to bupropion, other medicines, foods, dyes, or preservatives -breast-feeding -pregnant or trying to become pregnant How should I use this medicine? Take this medicine by mouth with a glass of water. Follow the directions on the prescription label. You can take it with or without food. If it upsets your stomach, take it with food. Do not cut, crush or chew this medicine. Take your medicine at regular intervals. If you take this medicine more than once a day, take your second dose at least 8 hours after you take your first dose. To limit difficulty in sleeping, avoid taking this medicine at bedtime. Do not take your medicine more often than directed.  Do not stop taking this medicine suddenly except upon the advice of your doctor. Stopping this medicine too quickly may cause serious side effects. A special MedGuide will be given to you by the pharmacist with each prescription and refill. Be sure to read this information carefully each time. Talk to your pediatrician regarding the use of this medicine in children. Special care may be needed. Overdosage: If you think you have taken too much of this medicine contact a poison control center or emergency room at once. NOTE: This medicine is only for you. Do not share this medicine with others. What if I miss a dose? If you miss a dose, skip the missed dose and take your next tablet at the regular time. There should be at least 8 hours between doses. Do not take double or extra  doses. What may interact with this medicine? Do not take this medicine with any of the following medications: -linezolid -MAOIs like Azilect, Carbex, Eldepryl, Marplan, Nardil, and Parnate -methylene blue (injected into a vein) -other medicines that contain bupropion like Wellbutrin This medicine may also interact with the following medications: -alcohol -certain medicines for anxiety or sleep -certain medicines for blood pressure like metoprolol, propranolol -certain medicines for depression or psychotic disturbances -certain medicines for HIV or AIDS like efavirenz, lopinavir, nelfinavir, ritonavir -certain medicines for irregular heart beat like propafenone, flecainide -certain medicines for Parkinson's disease like amantadine, levodopa -certain medicines for seizures like carbamazepine, phenytoin, phenobarbital -cimetidine -clopidogrel -cyclophosphamide -digoxin -furazolidone -isoniazid -nicotine -orphenadrine -procarbazine -steroid medicines like prednisone or cortisone -stimulant medicines for attention disorders, weight loss, or to stay awake -tamoxifen -theophylline -thiotepa -ticlopidine -tramadol -warfarin This list may not describe all possible interactions. Give your health care provider a list of all the medicines, herbs, non-prescription drugs, or dietary supplements you use. Also tell them if you smoke, drink alcohol, or use illegal drugs. Some items may interact with your medicine. What should I watch for while using this medicine? Visit your doctor or health care professional for regular checks on your progress. This medicine should be used together with a patient support program. It is important to participate in a behavioral program, counseling, or other support program that is recommended by your health care professional. Patients and their families should watch out for new or worsening thoughts of suicide or depression. Also watch out for sudden changes in  feelings such as feeling anxious, agitated, panicky, irritable, hostile, aggressive, impulsive, severely restless, overly excited and hyperactive, or not being able to sleep. If this happens, especially at the beginning of treatment or after a change in dose, call your health care professional. Avoid alcoholic drinks while taking this medicine. Drinking excessive alcoholic beverages, using sleeping or anxiety medicines, or quickly stopping the use of these agents while taking this medicine may increase your risk for a seizure. Do not drive or use heavy machinery until you know how this medicine affects you. This medicine can impair your ability to perform these tasks. Do not take this medicine close to bedtime. It may prevent you from sleeping. Your mouth may get dry. Chewing sugarless gum or sucking hard candy, and drinking plenty of water may help. Contact your doctor if the problem does not go away or is severe. Do not use nicotine patches or chewing gum without the advice of your doctor or health care professional while taking this medicine. You may need to have your blood pressure taken regularly if your doctor recommends that you use both nicotine and this medicine  together. What side effects may I notice from receiving this medicine? Side effects that you should report to your doctor or health care professional as soon as possible: -allergic reactions like skin rash, itching or hives, swelling of the face, lips, or tongue -breathing problems -changes in vision -confusion -elevated mood, decreased need for sleep, racing thoughts, impulsive behavior -fast or irregular heartbeat -hallucinations, loss of contact with reality -increased blood pressure -redness, blistering, peeling or loosening of the skin, including inside the mouth -seizures -suicidal thoughts or other mood changes -unusually weak or tired -vomiting Side effects that usually do not require medical attention (report to your  doctor or health care professional if they continue or are bothersome): -constipation -headache -loss of appetite -nausea -tremors -weight loss This list may not describe all possible side effects. Call your doctor for medical advice about side effects. You may report side effects to FDA at 1-800-FDA-1088. Where should I keep my medicine? Keep out of the reach of children. Store at room temperature between 20 and 25 degrees C (68 and 77 degrees F). Protect from light. Keep container tightly closed. Throw away any unused medicine after the expiration date. NOTE: This sheet is a summary. It may not cover all possible information. If you have questions about this medicine, talk to your doctor, pharmacist, or health care provider.  2018 Elsevier/Gold Standard (2016-06-05 13:49:28)

## 2018-03-09 NOTE — Progress Notes (Signed)
S:  [name] is a 47 y.o. female who presents to clinic for reevaluation from URI 2 weeks ago and for assistance with smoking cessation. At her last visit she voiced desire to quit but has not been able to. Prior attempts have included patches, gum, and "cold [place]" without success. She has no past hx of seizure disorder, no recent use of benzodiazepines, no past hx of eating disorders. No other health concerns at this visit.  Review of Systems  Constitutional: Negative.   HENT: Negative.   Respiratory: Negative.   Cardiovascular: Negative.   Gastrointestinal: Negative.   Musculoskeletal: Negative.   Skin: Negative.   Neurological: Negative for dizziness, tingling, focal weakness, seizures, loss of consciousness and headaches.  Psychiatric/Behavioral: Negative for depression, substance abuse and suicidal ideas. The patient is not nervous/anxious and does not have insomnia.    O: Vitals:   03/09/18 1651  BP: 124/80  Pulse: 73  Resp: 16  SpO2: 98%   Physical Exam  Constitutional: She appears well-developed and well-nourished. No distress.  HENT:  Head: Normocephalic and atraumatic.  Eyes: Conjunctivae are normal.  Cardiovascular: Normal rate and regular rhythm.  Pulmonary/Chest: Effort normal and breath sounds normal.  Neurological: She is alert.  Skin: Skin is warm and dry. She is not diaphoretic.  Psychiatric: She has a normal mood and affect. Her speech is normal and behavior is normal. Thought content normal. Her mood appears not anxious. Cognition and memory are normal. She does not exhibit a depressed mood.  Nursing note and vitals reviewed.   A: 1. Encounter for smoking cessation counseling     P: Provided extensive counseling on both pharmacological and non-pharmacological methods of smoking cessation, included educational handouts with regard to these treatments. Provided counseling on risk/benefits of medication assisted smoking cessation and need for monitoring  and follow up. Will start on Wellbutrin once daily for 3 days, then BID for 1 week. Cessation of smoking in 1-2 weeks, follow up in 2 weeks for reevaluation.

## 2018-03-21 ENCOUNTER — Ambulatory Visit (INDEPENDENT_AMBULATORY_CARE_PROVIDER_SITE_OTHER): Payer: Self-pay | Admitting: Emergency Medicine

## 2018-03-21 VITALS — BP 120/74 | HR 75 | Temp 99.1°F | Resp 17

## 2018-03-21 DIAGNOSIS — M545 Low back pain, unspecified: Secondary | ICD-10-CM

## 2018-03-21 DIAGNOSIS — Z5181 Encounter for therapeutic drug level monitoring: Secondary | ICD-10-CM

## 2018-03-21 LAB — POCT URINALYSIS DIPSTICK
Bilirubin, UA: NEGATIVE
Blood, UA: NEGATIVE
Glucose, UA: NEGATIVE
KETONES UA: NEGATIVE
Leukocytes, UA: NEGATIVE
NITRITE UA: NEGATIVE
PH UA: 7 (ref 5.0–8.0)
PROTEIN UA: NEGATIVE
SPEC GRAV UA: 1.01 (ref 1.010–1.025)
UROBILINOGEN UA: 0.2 U/dL

## 2018-03-21 MED ORDER — MELOXICAM 15 MG PO TABS: 15.0000 mg | ORAL_TABLET | Freq: Every day | ORAL | 0 refills | Status: DC

## 2018-03-21 MED ORDER — METHOCARBAMOL 500 MG PO TABS: 500.0000 mg | ORAL_TABLET | Freq: Four times a day (QID) | ORAL | 1 refills | Status: DC

## 2018-03-21 NOTE — Progress Notes (Signed)
S: [name] is a 47 y.o. female who presents with chief complaint of lower back pain. Ongoing for several weeks. She is concerned about UTI or kidney stone. Denies history of trauma or lifting heavy object. Denies dysuria, frequency, urgency or hematuria. Denies fever, chills, N/V/D.   Additionally, patient started on Wellbutrin 2 weeks ago for smoking cessation. She reports she is down to 6 cigarettes per day, is tolerating the medication well, and having reduced cravings. No psychiatric issues, states she feels well and her mood is focused.   Review of Systems  Constitutional: Negative for chills and fever.  Genitourinary: Negative for dysuria, frequency and urgency.  Musculoskeletal: Positive for back pain.  Neurological: Negative.   Psychiatric/Behavioral: Negative for depression. The patient is not nervous/anxious and does not have insomnia.     O: Vitals:   03/21/18 1709  BP: 120/74  Pulse: 75  Resp: 17  Temp: 99.1 F (37.3 C)  SpO2: 98%   Results for orders placed or performed in visit on 03/21/18  POCT urinalysis dipstick  Result Value Ref Range   Color, UA     Clarity, UA     Glucose, UA Negative    Bilirubin, UA Negative    Ketones, UA Negative    Spec Grav, UA 1.010 1.010 - 1.025   Blood, UA Negative    pH, UA 7.0 5.0 - 8.0   Protein, UA Negative    Urobilinogen, UA 0.2 0.2 or 1.0 E.U./dL   Nitrite, UA Negative    Leukocytes, UA Negative Negative   Appearance     Odor     Physical Exam  Constitutional: She appears well-developed and well-nourished.  HENT:  Head: Normocephalic and atraumatic.  Musculoskeletal:  Left lower back pain with TTP over the SI joint, negative straight leg raise, gait normal sensation intact distally  Neurological: She is alert.  Skin: Skin is warm. Capillary refill takes less than 2 seconds.  Psychiatric: She has a normal mood and affect. Her speech is normal and behavior is normal. Judgment and thought content normal. Her mood  appears not anxious. Cognition and memory are normal. She does not exhibit a depressed mood. She expresses no homicidal and no suicidal ideation. She is attentive.  Nursing note and vitals reviewed.   A:  1. Acute left-sided low back pain without sciatica   2. Medication monitoring encounter     P: 1. Acute left-sided low back pain without sciatica  - POCT urinalysis dipstick - DG Lumbar Spine Complete; Future - meloxicam (MOBIC) 15 MG tablet; Take 1 tablet (15 mg total) by mouth daily.  Dispense: 30 tablet; Refill: 0 - methocarbamol (ROBAXIN) 500 MG tablet; Take 1 tablet (500 mg total) by mouth 4 (four) times daily.  Dispense: 40 tablet; Refill: 1  2. Medication monitoring encounter Patient reports she has decreased cigarette consumption, is tolerating medication well without side effects. Continue medication, encouraged smoking cessation, follow up with PCP as needed.

## 2018-03-21 NOTE — Patient Instructions (Signed)

## 2018-03-22 MED FILL — MELOXICAM 15 MG TABLET: 15 | 30 days supply | Qty: 30 | Fill #0

## 2018-04-04 DIAGNOSIS — Z01419 Encounter for gynecological examination (general) (routine) without abnormal findings: Secondary | ICD-10-CM | POA: Diagnosis not present

## 2018-04-04 DIAGNOSIS — Z1389 Encounter for screening for other disorder: Secondary | ICD-10-CM | POA: Diagnosis not present

## 2018-04-04 DIAGNOSIS — Z1231 Encounter for screening mammogram for malignant neoplasm of breast: Secondary | ICD-10-CM | POA: Diagnosis not present

## 2018-04-04 DIAGNOSIS — Z130 Encounter for screening for diseases of the blood and blood-forming organs and certain disorders involving the immune mechanism: Secondary | ICD-10-CM | POA: Diagnosis not present

## 2018-04-12 ENCOUNTER — Other Ambulatory Visit: Payer: Self-pay | Admitting: Obstetrics and Gynecology

## 2018-04-12 DIAGNOSIS — N6489 Other specified disorders of breast: Secondary | ICD-10-CM

## 2018-04-15 ENCOUNTER — Ambulatory Visit: Payer: 59

## 2018-04-15 ENCOUNTER — Ambulatory Visit
Admission: RE | Admit: 2018-04-15 | Discharge: 2018-04-15 | Disposition: A | Discharge status: Home / Self Care | Payer: 59 | Source: Ambulatory Visit | Attending: Obstetrics and Gynecology | Admitting: Obstetrics and Gynecology

## 2018-04-15 DIAGNOSIS — R928 Other abnormal and inconclusive findings on diagnostic imaging of breast: Secondary | ICD-10-CM | POA: Diagnosis not present

## 2018-04-15 DIAGNOSIS — N6489 Other specified disorders of breast: Secondary | ICD-10-CM

## 2018-09-15 ENCOUNTER — Telehealth: Payer: 59 | Admitting: Family

## 2018-09-15 DIAGNOSIS — J329 Chronic sinusitis, unspecified: Secondary | ICD-10-CM | POA: Diagnosis not present

## 2018-09-15 DIAGNOSIS — B9689 Other specified bacterial agents as the cause of diseases classified elsewhere: Secondary | ICD-10-CM

## 2018-09-16 MED ORDER — AMOXICILLIN-POT CLAVULANATE 875-125 MG PO TABS: 1.0000 | ORAL_TABLET | Freq: Two times a day (BID) | ORAL | 0 refills | Status: AC

## 2018-09-16 MED FILL — AMOX-CLAV 875-125 MG TABLET: 875-125 | 7 days supply | Qty: 14 | Fill #0

## 2018-09-16 NOTE — Progress Notes (Signed)
Thank you for the details you included in the comment boxes. Those details are very helpful in determining the best course of treatment for you and help [place] to provide the best care.   Due to some type of glitch in the system, your visit did not come through to the provider properly when you submitted it yesterday. IT is currently investigating to ensure that this does not happen in the future. Please see your treatment plan below.  We are sorry that you are not feeling well.  Here is how we plan to help!  Based on what you have shared with me it looks like you have sinusitis.  Sinusitis is inflammation and infection in the sinus cavities of the head.  Based on your presentation I believe you most likely have Acute Bacterial Sinusitis.  This is an infection caused by bacteria and is treated with antibiotics. I have prescribed Augmentin 875mg /125mg  one tablet twice daily with food, for 7 days. You may use an oral decongestant such as Mucinex D or if you have glaucoma or high blood pressure use plain Mucinex. Saline nasal spray help and can safely be used as often as needed for congestion.  If you develop worsening sinus pain, fever or notice severe headache and vision changes, or if symptoms are not better after completion of antibiotic, please schedule an appointment with a health care provider.    Sinus infections are not as easily transmitted as other respiratory infection, however we still recommend that you avoid close contact with loved ones, especially the very young and elderly.  Remember to wash your hands thoroughly throughout the day as this is the number one way to prevent the spread of infection!  Home Care:  Only take medications as instructed by your medical team.  Complete the entire course of an antibiotic.  Do not take these medications with alcohol.  A steam or ultrasonic humidifier can help congestion.  You can place a towel over your head and breathe in the steam from hot water  coming from a faucet.  Avoid close contacts especially the very young and the elderly.  Cover your mouth when you cough or sneeze.  Always remember to wash your hands.  Get Help Right Away If:  You develop worsening fever or sinus pain.  You develop a severe head ache or visual changes.  Your symptoms persist after you have completed your treatment plan.  Make sure you  Understand these instructions.  Will watch your condition.  Will get help right away if you are not doing well or get worse.  Your e-visit answers were reviewed by a board certified advanced clinical practitioner to complete your personal care plan.  Depending on the condition, your plan could have included both over the counter or prescription medications.  If there is a problem please reply  once you have received a response from your provider.  Your safety is important to [place].  If you have drug allergies check your prescription carefully.    You can use MyChart to ask questions about today's visit, request a non-urgent call back, or ask for a work or school excuse for 24 hours related to this e-Visit. If it has been greater than 24 hours you will need to follow up with your provider, or enter a new e-Visit to address those concerns.  You will get an e-mail in the next two days asking about your experience.  I hope that your e-visit has been valuable and will speed your  recovery. Thank you for using e-visits.

## 2018-10-25 MED FILL — valACYclovir HCL 1 GM TABS: 1 | 7 days supply | Qty: 14 | Fill #0

## 2018-11-23 IMAGING — NM NM GASTRIC EMPTYING
3 series · 3 of 3 positions shown · non-contrast
Comparison: None.

CLINICAL DATA: Right upper quadrant pain with nausea and vomiting
for 6 months. Postprandial bloating and pain.

EXAM:
NUCLEAR MEDICINE GASTRIC EMPTYING SCAN
TECHNIQUE: After oral ingestion of radiolabeled meal, sequential abdominal
images were obtained for 120 minutes. Residual percentage of
activity remaining within the stomach was calculated at 60 and 120
minutes.
RADIOPHARMACEUTICALS:  2.0 mCi Kc-33m sulfur colloid in standardized
meal

[Series 1: 1 hr · 4.14mm/px · 1 of 1 slices shown]
[im 1/1]
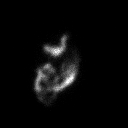

[Series 1: 0 min · 4.14mm/px · 1 of 1 slices shown]
[im 1/1]
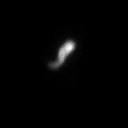

[Series 2: 2 hr · 4.14mm/px · 1 of 1 slices shown]
[im 1/1]
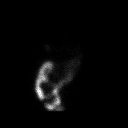

[3 of 3 positions shown; findings below may reference images not displayed]

FINDINGS: Expected location of the stomach in the left upper quadrant.
Ingested meal empties the stomach gradually over the course of the
study with 29% retention at 60 min and 3% retention at 120 min
(normal retention less than 30% at a 120 min).
IMPRESSION: Normal gastric emptying study.

## 2018-12-20 ENCOUNTER — Ambulatory Visit (INDEPENDENT_AMBULATORY_CARE_PROVIDER_SITE_OTHER): Payer: Self-pay | Admitting: Nurse Practitioner

## 2018-12-20 ENCOUNTER — Telehealth: Payer: 59 | Admitting: Physician Assistant

## 2018-12-20 VITALS — BP 110/82 | HR 89 | Temp 98.7°F | Wt 171.8 lb

## 2018-12-20 DIAGNOSIS — R350 Frequency of micturition: Secondary | ICD-10-CM

## 2018-12-20 DIAGNOSIS — R109 Unspecified abdominal pain: Secondary | ICD-10-CM

## 2018-12-20 DIAGNOSIS — Z87442 Personal history of urinary calculi: Secondary | ICD-10-CM | POA: Diagnosis not present

## 2018-12-20 DIAGNOSIS — M549 Dorsalgia, unspecified: Secondary | ICD-10-CM

## 2018-12-20 LAB — POCT URINALYSIS DIPSTICK
BILIRUBIN UA: NEGATIVE
Blood, UA: NEGATIVE
GLUCOSE UA: NEGATIVE
KETONES UA: NEGATIVE
Leukocytes, UA: NEGATIVE
NITRITE UA: NEGATIVE
PROTEIN UA: NEGATIVE
SPEC GRAV UA: 1.01 (ref 1.010–1.025)
Urobilinogen, UA: NEGATIVE E.U./dL — AB
pH, UA: 5 (ref 5.0–8.0)

## 2018-12-20 NOTE — Progress Notes (Signed)
Based on what you shared with me it looks like you have a serious condition that should be evaluated in a face to face office visit.  NOTE: If you entered your credit card information for this eVisit, you will not be charged. You may see a "hold" on your card for the $30 but that hold will drop off and you will not have a charge processed.  If you are having a true medical emergency please call 911.  If you need an urgent face to face visit, [facility] has four urgent care centers for your convenience.  If you need care fast and have a high deductible or no insurance consider:   [email or web address] to reserve your spot online an avoid wait times  [facility] [address], [address] [place], [place] [address] 8 am to 8 pm Monday-Friday 10 am to 4 pm Saturday-Sunday *Across the street from [employer]  [facility]  [address] [place], [address] 8 am to 5 pm Monday-Friday * In the [facility]   The following sites will take your  insurance:  . [facility]  [phone] Get Driving Directions Find a Provider at this Location  [address] [place], [place] [address] . 10 am to 8 pm Monday-Friday . 12 pm to 8 pm Saturday-Sunday   . [facility]  [phone] Get Driving Directions Find a Provider at this Location  [address], [address] , [place] [address] . 8 am to 8 pm Monday-Friday . 9 am to 6 pm Saturday . 11 am to 6 pm Sunday   . [facility] [place]  [phone] Get Driving Directions  [address] [place],  [address] . 8 am to 8 pm Monday-Friday . 8 am to 4 pm Saturday-Sunday   Your e-visit answers were reviewed by a board certified advanced clinical practitioner to complete your personal care plan.  Thank you for using e-Visits.  

## 2018-12-20 NOTE — Progress Notes (Signed)
Subjective:    [name] is a 47 y.o. female who complains of abnormal smelling urine for 1 day.  Patient also complains of flank pain and back pain. Patient denies fever, headache, rhinitis, sorethroat and vaginal discharge.  Patient rates current pain 5/10 at present.  Patient states on yesterday, her pain rated as high as 10 out of 10.  Patient does not have a history of recurrent UTI.  Patient does not have a history of pyelonephritis.  Patient does inform she does have a history of kidney stones.  Patient states her last kidney stone was about 26 years ago and seems to think this is what she may be experiencing at this time.  The following portions of the patient's history were reviewed and updated as appropriate: allergies, current medications and past medical history. Review of Systems Constitutional: negative Eyes: negative Ears, nose, mouth, throat, and face: negative Respiratory: negative Cardiovascular: negative Gastrointestinal: positive for nausea, negative for abdominal pain, change in bowel habits, constipation and vomiting Genitourinary:positive for abnormal smelling urine, negative for dysuria, frequency, hematuria and hesitancy   Musculoskeletal: positive for back pain   Objective:    BP 110/82 (BP Location: Right Arm, Patient Position: Sitting)   Pulse 89   Temp 98.7 F (37.1 C) (Oral)   Wt 171 lb 12.8 oz (77.9 kg)   SpO2 100%   BMI 32.46 kg/m  General: alert, cooperative and appears uncomfortable  Abdomen: soft, non-tender, without masses or organomegaly in the entire abdomen  Back: back muscles are full ROM, +left CVA tenderness  GU: defer exam   Laboratory:  Urine dipstick shows negative for all components.   Micro exam: not done.    Assessment:  Suspect Kidney Stone   Plan:   Exam findings, diagnosis etiology and medication use and indications reviewed with patient. Follow- Up and discharge instructions provided. No emergent/urgent issues found on  exam.  Suspect this patient is experiencing kidney stones.  Discussed with the patient that she will need to go to the emergency department for treatment and management of pain.  Informed patient that imaging will confirm whether or not there is a ureteral obstruction.  Patient verbalizes understanding at this time.  Patient education was provided. Patient verbalized understanding of information provided and agrees with plan of care (POC), all questions answered. The patient is advised to call or return to clinic if condition does not see an improvement in symptoms, or to seek the care of the closest emergency department if condition worsens with the above plan.    1. Flank pain  - POCT Urinalysis Dipstick- negative for all components. -I suspect you may have a diagnosis of kidney stones.  Provided a urine strainer for the patient you need to go to the emergency department for imaging to confirm there is no ureteral obstruction.  Follow-up in our office as needed.

## 2018-12-20 NOTE — Patient Instructions (Signed)
Kidney Stones -I suspect you may have a diagnosis of kidney stones.  You need to go to the emergency department for imaging to confirm there is no ureteral obstruction.  Follow-up in our office as needed.   Kidney stones (urolithiasis) are solid, rock-like deposits that form inside of the organs that make urine (kidneys). A kidney stone may form in a kidney and move into the bladder, where it can cause intense pain and block the flow of urine. Kidney stones are created when high levels of certain minerals are found in the urine. They are usually passed through urination, but in some cases, medical treatment may be needed to remove them. What are the causes? Kidney stones may be caused by:  A condition in which certain glands produce too much parathyroid hormone (primary hyperparathyroidism), which causes too much calcium buildup in the blood.  Buildup of uric acid crystals in the bladder (hyperuricosuria). Uric acid is a chemical that the body produces when you eat certain foods. It usually exits the body in the urine.  Narrowing (stricture) of one or both of the tubes that drain urine from the kidneys to the bladder (ureters).  A kidney blockage that is present at birth (congenital obstruction).  Past surgery on the kidney or the ureters, such as gastric bypass surgery. What increases the risk? The following factors make you more likely to develop kidney stones:  Having had a kidney stone in the past.  Having a family history of kidney stones.  Not drinking enough water.  Eating a diet that is high in protein, salt (sodium), or sugar.  Being overweight or obese. What are the signs or symptoms? Symptoms of a kidney stone may include:  Nausea.  Vomiting.  Blood in the urine (hematuria).  Pain in the side of the abdomen, right below the ribs (flank pain). Pain usually spreads (radiates) to the groin.  Needing to urinate frequently or urgently. How is this diagnosed? This  condition may be diagnosed based on:  Your medical history.  A physical exam.  Blood tests.  Urine tests.  CT scan.  Abdominal X-ray.  A procedure to examine the inside of the bladder (cystoscopy). How is this treated? Treatment for kidney stones depends on the size, location, and makeup of the stones. Treatment may involve:  Analyzing your urine before and after you pass the stone through urination.  Being monitored at the hospital until you pass the stone through urination.  Increasing your fluid intake and decreasing the amount of calcium and protein in your diet.  A procedure to break up kidney stones in the bladder using: ? A focused beam of light (laser therapy). ? Shock waves (extracorporeal shock wave lithotripsy).  Surgery to remove kidney stones. This may be needed if you have severe pain or have stones that block your urinary tract. Follow these instructions at home: Eating and drinking  Drink enough fluid to keep your urine clear or pale yellow. This will help you to pass the kidney stone.  If directed, change your diet. This may include: ? Limiting how much sodium you eat. ? Eating more fruits and vegetables. ? Limiting how much meat, poultry, fish, and eggs you eat.  Follow instructions from your health care provider about eating or drinking restrictions. General instructions  Collect urine samples as told by your health care provider. You may need to collect a urine sample: ? 24 hours after you pass the stone. ? 8-12 weeks after passing the kidney stone, and every  6-12 months after that.  Strain your urine every time you urinate, for as long as directed. Use the strainer that your health care provider recommends.  Do not throw out the kidney stone after passing it. Keep the stone so it can be tested by your health care provider. Testing the makeup of your kidney stone may help prevent you from getting kidney stones in the future.  Take over-the-counter  and prescription medicines only as told by your health care provider.  Keep all follow-up visits as told by your health care provider. This is important. You may need follow-up X-rays or ultrasounds to make sure that your stone has passed. How is this prevented? To prevent another kidney stone:  Drink enough fluid to keep your urine clear or pale yellow. This is the best way to prevent kidney stones.  Eat a healthy diet and follow recommendations from your health care provider about foods to avoid. You may be instructed to eat a low-protein diet. Recommendations vary depending on the type of kidney stone that you have.  Maintain a healthy weight. Contact a health care provider if:  You have pain that gets worse or does not get better with medicine. Get help right away if:  You have a fever or chills.  You develop severe pain.  You develop new abdominal pain.  You faint.  You are unable to urinate. This information is not intended to replace advice given to you by your health care provider. Make sure you discuss any questions you have with your health care provider. Document Released: 12/14/2005 Document Revised: 05/27/2017 Document Reviewed: 05/29/2016 Elsevier Interactive Patient Education  2019 [employer].

## 2019-01-03 ENCOUNTER — Telehealth: Payer: 59 | Admitting: Physician Assistant

## 2019-01-03 DIAGNOSIS — J019 Acute sinusitis, unspecified: Secondary | ICD-10-CM

## 2019-01-03 DIAGNOSIS — B9689 Other specified bacterial agents as the cause of diseases classified elsewhere: Secondary | ICD-10-CM

## 2019-01-03 MED ORDER — AMOXICILLIN-POT CLAVULANATE 875-125 MG PO TABS: 1.0000 | ORAL_TABLET | Freq: Two times a day (BID) | ORAL | 0 refills | Status: AC

## 2019-01-03 MED FILL — AMOX-CLAV 875-125 MG TABLET: 875-125 | 10 days supply | Qty: 20 | Fill #0

## 2019-01-03 NOTE — Progress Notes (Signed)
We are sorry that you are not feeling well.Here is how we plan to help!  Based on what you have shared with me it looks like you have sinusitis.Sinusitis is inflammation and infection in the sinus cavities of the head.Based on your presentation I believe you most likely have Acute Bacterial Sinusitis.This is an infection caused by bacteria and is treated with antibiotics. I have prescribed Augmentin 875mg /125mg  one tablet twice daily with food, for 7 days. You may use an oral decongestant such as Mucinex D or if you have glaucoma or high blood pressure use plain Mucinex. Saline nasal spray help and can safely be used as often as needed for congestion.If you develop worsening sinus pain, fever or notice severe headache and vision changes, or if symptoms are not better after completion of antibiotic, please schedule an appointment with a health care provider.  Sinus infections are not as easily transmitted as other respiratory infection, however we still recommend that you avoid close contact with loved ones, especially the very young and elderly.Remember to wash your hands thoroughly throughout the day as this is the number one way to prevent the spread of infection!  Home Care: Only take medications as instructed by your medical team. Complete the entire course of an antibiotic. Do not take these medications with alcohol. A steam or ultrasonic humidifier can help congestion.You can place a towel over your head and breathe in the steam from hot water coming from a faucet. Avoid close contacts especially the very young and the elderly. Cover your mouth when you cough or sneeze. Always remember to wash your hands.  Get Help Right Away If: You develop worsening fever or sinus pain. You develop a severe head ache or visual changes. Your symptoms persist after you have completed your treatment plan.  Make sure you Understand these instructions. Will watch your condition. Will get  help right away if you are not doing well or get worse.  Your e-visit answers were reviewed by a board certified advanced clinical practitioner to complete your personal care plan.Depending on the condition, your plan could have included both over the counter or prescription medications.  If there is a problem please reply once you have received a response from your provider.  Your safety is important to [place].If you have drug allergies check your prescription carefully.  You can use MyChart to ask questions about today's visit, request a non-urgent call back, or ask for a work or school excuse for 24 hours related to this e-Visit. If it has been greater than 24 hours you will need to follow up with your provider, or enter a new e-Visit to address those concerns.  You will get an e-mail in the next two days asking about your experience.I hope that your e-visit has been valuable and will speed your recovery. Thank you for using e-visits.     ----- Message -----  From: [name]  Sent: 01/03/2019 11:02 AM EST  To: E-Visit Mailing List Subject: E-Visit Submission: Sinus Problems  E-Visit Submission: Sinus Problems --------------------------------  Question: Which of the following have you been experiencing? Answer: Congested nose  Pain around the nose and face  Headache  Question: Have these symptoms significantly worsened over the last two to three days? Answer: Yes  Question: Have you had any of the following? Answer: None of the above  Question: How long have you been having these symptoms? Answer: 7 or more days  Question: Do you have a fever? Answer: I do not  know  Question: Do you smoke? Answer: Yes  Question: Do you have any chronic illnesses, such as diabetes, heart disease, kidney disease, or lung disease, or any illness that would weaken your body's ability to fight infection? Answer: No  Question: When you blow  your nose, what color is the mucus? Answer: Mostly thin and yellow or green  Question: Have you experienced similar problems in the past? Answer: Yes  Question: What treatments have worked in the past?  Answer: Flonaise - tried it - still severe head pressure, not draining  Antibiotics usually fix it - especially as it is moving into my ears  Question: What treatment(s) in the past have been unsuccessful? Answer: Flonaise/nasal sprays alone  Question: Is this illness similar to previous illnesses you have had?How is it the same?How is it different? Answer: Yes, I get this every January, April and August as seasons change - especially weather and we moved into a new space at work.Head pressure is awful  Question: Have you recently been hospitalized? Answer: No  Question: What medications are you currently taking for these symptoms? Answer: Decongestants  Nose spray  Question: Please enter the names of any medications you are taking, or any other treatments you are trying. Answer: Flonaise and Ibuoprofen or Sudafed  Question: Are you pregnant? Answer: I am confident that I am not pregnant  Question: Are you breastfeeding? Answer: No  Question: Please list your medication allergies that you may have ? (If 'none' , please list as 'none') Answer: NONE  Question: Please list any additional comments  Answer:

## 2019-01-26 ENCOUNTER — Encounter: Payer: Self-pay | Admitting: Physician Assistant

## 2019-01-27 NOTE — Progress Notes (Signed)
This encounter was created in error - please disregard.

## 2019-02-01 ENCOUNTER — Other Ambulatory Visit: Payer: Self-pay | Admitting: Medical

## 2019-02-01 ENCOUNTER — Ambulatory Visit: Payer: Self-pay | Admitting: Physician Assistant

## 2019-02-01 VITALS — BP 115/80 | HR 78 | Temp 98.6°F | Resp 16 | Wt 170.2 lb

## 2019-02-01 DIAGNOSIS — L089 Local infection of the skin and subcutaneous tissue, unspecified: Secondary | ICD-10-CM

## 2019-02-01 DIAGNOSIS — B002 Herpesviral gingivostomatitis and pharyngotonsillitis: Secondary | ICD-10-CM

## 2019-02-01 MED ORDER — CEPHALEXIN 500 MG PO CAPS: 500.0000 mg | ORAL_CAPSULE | Freq: Four times a day (QID) | ORAL | 0 refills | Status: AC

## 2019-02-01 MED ORDER — VALACYCLOVIR HCL 1 G PO TABS: ORAL_TABLET | ORAL | 0 refills | Status: DC

## 2019-02-01 NOTE — Progress Notes (Signed)
[name]  MRN: [id number] DOB: 11-17-1971  Subjective:  [name] [name] is a 48 y.o. female seen in office today for a chief complaint of red painful bump on left upper arm. Has been there for one week. Has not grown in size or became more painful that she is aware of. Has been taking ibuprofen for the pain and it has been helping. Has always had a "black head" in that particular spot. Denies acute injury. Denies fever, chills, nausea, vomiting, numbness, and tingling. No PMH of DM, HTN, HIV, or MRSA. Denies smoking.   Also needs refills for valtrex. Takes it on an as needed basis for oral HSV. Called her PCP to get refills but does not think he will get it sent in until tomorrow. Has a small lesion on upper lip. Has associated tingling and burning. Typically has an outbreak when she gets sick, is stressed, or has lots of sun exposure. Denies any other questions or concerns.   Review of Systems  Constitutional: Negative for activity change, appetite change, chills, diaphoresis and fatigue.  Gastrointestinal: Negative for abdominal pain.  Neurological: Negative for dizziness.    Patient Active Problem List   Diagnosis Date Noted  . Trapezius strain 11/30/2014  . Migraine 11/16/2014  . Migraines 11/08/2014  . 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 Headache(784.0) 09/01/2012  . Angiomyolipoma of kidney, left 05/05/2012  . Abdominal pain 05/01/2012  . COCCYGEAL PAIN 09/26/2009  . PELVIC  PAIN 05/31/2009  . TOBACCO ABUSE 03/26/2009  . EDEMA 03/26/2009  . WEIGHT GAIN 03/26/2009  . NEPHROLITHIASIS, HX OF 03/26/2009    Current Outpatient Medications on File Prior to Visit  Medication Sig Dispense Refill  . acetaminophen (TYLENOL) 325 MG tablet Take 325 mg by mouth every 6 (six) hours as needed (for pain.).    Marland Kitchen azelastine (ASTELIN) 0.1 % nasal spray Place into the nose.    . cetirizine (ZYRTEC) 10 MG tablet Take 5 mg by mouth at bedtime.     . fluticasone (FLONASE) 50 MCG/ACT nasal spray Place  into the nose.    . ibuprofen (ADVIL,MOTRIN) 200 MG tablet Take 200 mg by mouth every 6 (six) hours as needed.    . valACYclovir (VALTREX) 1000 MG tablet      No current facility-administered medications on file prior to visit.     Allergies  Allergen Reactions  . Morphine And Related     Nausea and vomiting     Objective:  BP 115/80 (BP Location: Right Arm, Patient Position: Sitting, Cuff Size: Normal)   Pulse 78   Temp 98.6 F (37 C) (Oral)   Resp 16   Wt 170 lb 3.2 oz (77.2 kg)   SpO2 98%   BMI 32.16 kg/m   Physical Exam Vitals signs reviewed.  Constitutional:      Appearance: She is well-developed.  HENT:     Head: Normocephalic and atraumatic.  Eyes:     Conjunctiva/sclera: Conjunctivae normal.  Neck:     Musculoskeletal: Normal range of motion.  Pulmonary:     Effort: Pulmonary effort is normal.  Skin:    General: Skin is warm and dry.       Neurological:     Mental Status: She is alert and oriented to person, place, and time.             Assessment and Plan :  1. Skin infection Appearance is consistent with infected sebaceous cyst. There is no overlying fluctuance at this time so I&D is  not warranted. No streaking noted. She is afebrile. Would rec oral abx, warm compresses, and OTC ibuprofen as needed. Follow up with family doctor or dermatology.  - cephALEXin (KEFLEX) 500 MG capsule; Take 1 capsule (500 mg total) by mouth 4 (four) times daily for 7 days.  Dispense: 28 capsule; Refill: 0  2. Recurrent oral herpes simplex Refills provided. F/u with family doctor for further refills.  - valACYclovir (VALTREX) 1000 MG tablet; Take 2 tablets by mouth twice daily for 1 day. Start ASAP with symptom onset.  Dispense: 10 tablet; Refill: 0   Tenna Delaine PA-C  [facility] 02/01/2019 5:42 PM

## 2019-02-01 NOTE — Patient Instructions (Addendum)
Cellulitis, Adult   Take the antibiotic as prescribed. Apply a warm compress to the area for 15-20 minutes 2-4 times each day. Use Ibuprofen 400 mg orally every 4 to 6 hours as needed for pain as needed. Follow up with dermatology.   Cellulitis is a skin infection. The infected area is often warm, red, swollen, and sore. It occurs most often in the arms and lower legs. It is very important to get treated for this condition. What are the causes? This condition is caused by bacteria. The bacteria enter through a break in the skin, such as a cut, burn, insect bite, open sore, or crack. What increases the risk? This condition is more likely to occur in people who:  Have a weak body defense system (immune system).  Have open cuts, burns, bites, or scrapes on the skin.  Are older than 50 years of age.  Have a blood sugar problem (diabetes).  Have a long-lasting (chronic) liver disease (cirrhosis) or kidney disease.  Are very overweight (obese).  Have a skin problem, such as: ? Itchy rash (eczema). ? Slow movement of blood in the veins (venous stasis). ? Fluid buildup below the skin (edema).  Have been treated with high-energy rays (radiation).  Use IV drugs. What are the signs or symptoms? Symptoms of this condition include:  Skin that is: ? Red. ? Streaking. ? Spotting. ? Swollen. ? Sore or painful when you touch it. ? Warm.  A fever.  Chills.  Blisters. How is this diagnosed? This condition is diagnosed based on:  Medical history.  Physical exam.  Blood tests.  Imaging tests. How is this treated? Treatment for this condition may include:  Medicines to treat infections or allergies.  Home care, such as: ? Rest. ? Placing cold or warm cloths (compresses) on the skin.  Hospital care, if the condition is very bad. Follow these instructions at home: Medicines  Take over-the-counter and prescription medicines only as told by your doctor.  If you were  prescribed an antibiotic medicine, take it as told by your doctor. Do not stop taking it even if you start to feel better. General instructions   Drink enough fluid to keep your pee (urine) pale yellow.  Do not touch or rub the infected area.  Raise (elevate) the infected area above the level of your heart while you are sitting or lying down.  Place cold or warm cloths on the area as told by your doctor.  Keep all follow-up visits as told by your doctor. This is important. Contact a doctor if:  You have a fever.  You do not start to get better after 1-2 days of treatment.  Your bone or joint under the infected area starts to hurt after the skin has healed.  Your infection comes back. This can happen in the same area or another area.  You have a swollen bump in the area.  You have new symptoms.  You feel ill and have muscle aches and pains. Get help right away if:  Your symptoms get worse.  You feel very sleepy.  You throw up (vomit) or have watery poop (diarrhea) for a long time.  You see red streaks coming from the area.  Your red area gets larger.  Your red area turns dark in color. These symptoms may represent a serious problem that is an emergency. Do not wait to see if the symptoms will go away. Get medical help right away. Call your local emergency services (911 in the  [place] Do not drive yourself to the hospital. Summary  Cellulitis is a skin infection. The area is often warm, red, swollen, and sore.  This condition is treated with medicines, rest, and cold and warm cloths.  Take all medicines only as told by your doctor.  Tell your doctor if symptoms do not start to get better after 1-2 days of treatment. This information is not intended to replace advice given to you by your health care provider. Make sure you discuss any questions you have with your health care provider. Document Released: 06/01/2008 Document Revised: 05/05/2018 Document Reviewed:  05/05/2018 Elsevier Interactive Patient Education  2019 [employer].

## 2019-02-02 DIAGNOSIS — L723 Sebaceous cyst: Secondary | ICD-10-CM | POA: Diagnosis not present

## 2019-02-02 DIAGNOSIS — Z23 Encounter for immunization: Secondary | ICD-10-CM | POA: Diagnosis not present

## 2019-02-03 ENCOUNTER — Telehealth: Payer: Self-pay

## 2019-02-03 NOTE — Telephone Encounter (Signed)
Patient states she went to the Dermatologist yesterday and they removed the cyst, and she has pain where the cyst was removed.

## 2019-02-16 DIAGNOSIS — H5213 Myopia, bilateral: Secondary | ICD-10-CM | POA: Diagnosis not present

## 2019-02-16 DIAGNOSIS — H52203 Unspecified astigmatism, bilateral: Secondary | ICD-10-CM | POA: Diagnosis not present

## 2019-02-16 DIAGNOSIS — H524 Presbyopia: Secondary | ICD-10-CM | POA: Diagnosis not present

## 2019-03-06 ENCOUNTER — Ambulatory Visit: Payer: Self-pay | Admitting: *Deleted

## 2019-03-06 ENCOUNTER — Telehealth: Payer: Self-pay | Admitting: Medical

## 2019-03-06 MED ORDER — OSELTAMIVIR PHOSPHATE 75 MG PO CAPS: 75.0000 mg | ORAL_CAPSULE | Freq: Two times a day (BID) | ORAL | 0 refills | Status: DC

## 2019-03-06 NOTE — Telephone Encounter (Signed)
Rx tamfilu sent to pt pharmacy. But advise is to only use for early onset flu like symptoms.

## 2019-03-06 NOTE — Telephone Encounter (Signed)
    Pt called requesting tamiflu; she says that 3 people in her office have tested positive for flu, in the past 2 days; she has no symptoms; the pt did get a flu shot this year; she confirms that her pharmacy is [facility]; she can be contacted at [phone] and a message can be left on the voicemail; she was last seen by Evern Core, [place], 02/24/17; will route to office for final disposition.  Reason for Disposition . [1] Influenza EXPOSURE within last 48 hours (2 days) AND [2] NOT HIGH RISK AND [3] strongly requests antiviral medication  Answer Assessment - Initial Assessment Questions 1. TYPE of EXPOSURE: "How were you exposed?" (e.g., close contact, not a close contact)     Close contact: 3 employees positvie flu 2. DATE of EXPOSURE: "When did the exposure occur?" (e.g., hour, days, weeks)     03/03/2019 3. PREGNANCY: "Is there any chance you are pregnant?" "When was your last menstrual period?"     No hysterectomy 4. HIGH RISK for COMPLICATIONS: "Do you have any heart or lung problems? Do you have a weakened immune system?" (e.g., CHF, COPD, asthma, HIV positive, chemotherapy, renal failure, diabetes mellitus, sickle cell anemia)     no 5. SYMPTOMS: "Do you have any symptoms?" (e.g., cough, fever, sore throat, difficulty breathing).     no  Protocols used: INFLUENZA EXPOSURE-A-AH

## 2019-03-06 NOTE — Telephone Encounter (Signed)
Pt has been vaccinate but close exposure. Let her know sent in tamilfu.but only use in event of early onset flu like symptoms. If no symptoms then don't recommend taking.

## 2019-03-06 NOTE — Telephone Encounter (Signed)
Please advise 

## 2019-03-06 NOTE — Addendum Note (Signed)
Addended by: Anabel Halon on: 03/06/2019 07:07 PM   Modules accepted: Orders

## 2019-03-07 MED FILL — OSELTAMIVIR PHOSPHATE 75 MG: 75 | 5 days supply | Qty: 10 | Fill #0

## 2019-03-08 DIAGNOSIS — Z363 Encounter for antenatal screening for malformations: Secondary | ICD-10-CM | POA: Diagnosis not present

## 2019-05-02 ENCOUNTER — Telehealth: Payer: 59 | Admitting: Nurse Practitioner

## 2019-05-02 DIAGNOSIS — J0100 Acute maxillary sinusitis, unspecified: Secondary | ICD-10-CM | POA: Diagnosis not present

## 2019-05-02 MED ORDER — AMOXICILLIN-POT CLAVULANATE 875-125 MG PO TABS
1.0000 | ORAL_TABLET | Freq: Two times a day (BID) | ORAL | 0 refills | Status: DC
Start: 2019-05-02 — End: 2020-04-11

## 2019-05-02 NOTE — Progress Notes (Signed)

## 2019-05-03 MED FILL — AMOX-CLAV 875-125 MG TABLET: 875-125 | 7 days supply | Qty: 14 | Fill #0

## 2019-06-13 DIAGNOSIS — Z23 Encounter for immunization: Secondary | ICD-10-CM | POA: Diagnosis not present

## 2019-06-23 DIAGNOSIS — O368130 Decreased fetal movements, third trimester, not applicable or unspecified: Secondary | ICD-10-CM | POA: Diagnosis not present

## 2019-06-23 DIAGNOSIS — Z3A34 34 weeks gestation of pregnancy: Secondary | ICD-10-CM | POA: Diagnosis not present

## 2019-07-11 DIAGNOSIS — O99820 Streptococcus B carrier state complicating pregnancy: Secondary | ICD-10-CM | POA: Diagnosis not present

## 2019-07-11 DIAGNOSIS — Z3A37 37 weeks gestation of pregnancy: Secondary | ICD-10-CM | POA: Diagnosis not present

## 2019-07-11 DIAGNOSIS — O1404 Mild to moderate pre-eclampsia, complicating childbirth: Secondary | ICD-10-CM | POA: Diagnosis not present

## 2019-07-12 DIAGNOSIS — O99820 Streptococcus B carrier state complicating pregnancy: Secondary | ICD-10-CM | POA: Diagnosis not present

## 2019-07-12 DIAGNOSIS — Z3A37 37 weeks gestation of pregnancy: Secondary | ICD-10-CM | POA: Diagnosis not present

## 2019-07-12 DIAGNOSIS — O1404 Mild to moderate pre-eclampsia, complicating childbirth: Secondary | ICD-10-CM | POA: Diagnosis not present

## 2019-07-13 DIAGNOSIS — Z3A37 37 weeks gestation of pregnancy: Secondary | ICD-10-CM | POA: Diagnosis not present

## 2019-07-13 DIAGNOSIS — O99824 Streptococcus B carrier state complicating childbirth: Secondary | ICD-10-CM | POA: Diagnosis not present

## 2019-07-13 DIAGNOSIS — O1404 Mild to moderate pre-eclampsia, complicating childbirth: Secondary | ICD-10-CM | POA: Diagnosis not present

## 2019-09-18 ENCOUNTER — Telehealth: Payer: 59 | Admitting: Physician Assistant

## 2019-09-18 DIAGNOSIS — J329 Chronic sinusitis, unspecified: Secondary | ICD-10-CM

## 2019-09-18 MED ORDER — AMOXICILLIN 875 MG PO TABS: 875.0000 mg | ORAL_TABLET | Freq: Two times a day (BID) | ORAL | 0 refills | Status: DC

## 2019-09-18 MED FILL — AMOXICILLIN 875 MG TABS: 875 | 7 days supply | Qty: 14 | Fill #0

## 2019-09-18 NOTE — Progress Notes (Signed)
Hi [name],  We are sorry that you are not feeling well.  I see this is a consistent problem for you.  Please see your Primary Care Provider for your next battle with sinus problems.  You may need to see a specialist    Here is how we plan to help!  Based on what you have shared with me it looks like you have sinusitis.  Sinusitis is inflammation and infection in the sinus cavities of the head.  Based on your presentation I believe you most likely have Acute Bacterial Sinusitis.  This is an infection caused by bacteria and is treated with antibiotics. I have prescribed Amoxicillin 875 mg twice daily  for 7 days You may use an oral decongestant such as Mucinex D or if you have glaucoma or high blood pressure use plain Mucinex. Saline nasal spray help and can safely be used as often as needed for congestion.  If you develop worsening sinus pain, fever or notice severe headache and vision changes, or if symptoms are not better after completion of antibiotic, please schedule an appointment with a health care provider.    Sinus infections are not as easily transmitted as other respiratory infection, however we still recommend that you avoid close contact with loved ones, especially the very young and elderly.  Remember to wash your hands thoroughly throughout the day as this is the number one way to prevent the spread of infection!  Home Care:  Only take medications as instructed by your medical team.  Complete the entire course of an antibiotic.  Do not take these medications with alcohol.  A steam or ultrasonic humidifier can help congestion.  You can place a towel over your head and breathe in the steam from hot water coming from a faucet.  Avoid close contacts especially the very young and the elderly.  Cover your mouth when you cough or sneeze.  Always remember to wash your hands.  Get Help Right Away If:  You develop worsening fever or sinus pain.  You develop a severe head ache or visual  changes.  Your symptoms persist after you have completed your treatment plan.  Make sure you  Understand these instructions.  Will watch your condition.  Will get help right away if you are not doing well or get worse.  Your e-visit answers were reviewed by a board certified advanced clinical practitioner to complete your personal care plan.  Depending on the condition, your plan could have included both over the counter or prescription medications.  If there is a problem please reply  once you have received a response from your provider.  Your safety is important to [place].  If you have drug allergies check your prescription carefully.    You can use MyChart to ask questions about today's visit, request a non-urgent call back, or ask for a work or school excuse for 24 hours related to this e-Visit. If it has been greater than 24 hours you will need to follow up with your provider, or enter a new e-Visit to address those concerns.  You will get an e-mail in the next two days asking about your experience.  I hope that your e-visit has been valuable and will speed your recovery. Thank you for using e-visits.   Greater than 5 minutes, yet less than 10 minutes of time have been spent researching, coordinating and implementing care for this patient today.

## 2019-09-25 DIAGNOSIS — Z130 Encounter for screening for diseases of the blood and blood-forming organs and certain disorders involving the immune mechanism: Secondary | ICD-10-CM | POA: Diagnosis not present

## 2019-09-25 DIAGNOSIS — E669 Obesity, unspecified: Secondary | ICD-10-CM | POA: Diagnosis not present

## 2019-09-25 DIAGNOSIS — Z1231 Encounter for screening mammogram for malignant neoplasm of breast: Secondary | ICD-10-CM | POA: Diagnosis not present

## 2019-09-25 DIAGNOSIS — Z01419 Encounter for gynecological examination (general) (routine) without abnormal findings: Secondary | ICD-10-CM | POA: Diagnosis not present

## 2019-09-25 DIAGNOSIS — Z1389 Encounter for screening for other disorder: Secondary | ICD-10-CM | POA: Diagnosis not present

## 2019-09-29 ENCOUNTER — Other Ambulatory Visit: Payer: Self-pay | Admitting: Medical

## 2019-09-29 DIAGNOSIS — B002 Herpesviral gingivostomatitis and pharyngotonsillitis: Secondary | ICD-10-CM

## 2019-09-29 MED FILL — valACYclovir HCL 1 GM TABS: 1 | 7 days supply | Qty: 14 | Fill #0

## 2019-09-29 NOTE — Telephone Encounter (Signed)
Pt is due for follow up

## 2019-10-06 DIAGNOSIS — Z23 Encounter for immunization: Secondary | ICD-10-CM | POA: Diagnosis not present

## 2019-10-09 DIAGNOSIS — R0981 Nasal congestion: Secondary | ICD-10-CM | POA: Diagnosis not present

## 2020-01-09 MED FILL — valACYclovir HCL 1 GM TABS: 1 | 7 days supply | Qty: 14 | Fill #1

## 2020-04-07 ENCOUNTER — Other Ambulatory Visit: Payer: Self-pay

## 2020-04-07 ENCOUNTER — Emergency Department (HOSPITAL_BASED_OUTPATIENT_CLINIC_OR_DEPARTMENT_OTHER): Payer: 59

## 2020-04-07 ENCOUNTER — Emergency Department (HOSPITAL_BASED_OUTPATIENT_CLINIC_OR_DEPARTMENT_OTHER)
Admission: EM | Admit: 2020-04-07 | Discharge: 2020-04-07 | Disposition: A | Discharge status: Home / Self Care | Payer: 59 | Attending: Emergency Medicine | Admitting: Emergency Medicine

## 2020-04-07 ENCOUNTER — Encounter (HOSPITAL_BASED_OUTPATIENT_CLINIC_OR_DEPARTMENT_OTHER): Payer: Self-pay | Admitting: Emergency Medicine

## 2020-04-07 DIAGNOSIS — F17210 Nicotine dependence, cigarettes, uncomplicated: Secondary | ICD-10-CM | POA: Diagnosis not present

## 2020-04-07 DIAGNOSIS — M542 Cervicalgia: Secondary | ICD-10-CM | POA: Diagnosis not present

## 2020-04-07 DIAGNOSIS — Z885 Allergy status to narcotic agent status: Secondary | ICD-10-CM | POA: Insufficient documentation

## 2020-04-07 DIAGNOSIS — Z79899 Other long term (current) drug therapy: Secondary | ICD-10-CM | POA: Diagnosis not present

## 2020-04-07 DIAGNOSIS — M47812 Spondylosis without myelopathy or radiculopathy, cervical region: Secondary | ICD-10-CM | POA: Diagnosis not present

## 2020-04-07 DIAGNOSIS — M25512 Pain in left shoulder: Secondary | ICD-10-CM | POA: Diagnosis not present

## 2020-04-07 DIAGNOSIS — M79602 Pain in left arm: Secondary | ICD-10-CM | POA: Diagnosis not present

## 2020-04-07 MED ORDER — METHOCARBAMOL 500 MG PO TABS: 500.0000 mg | ORAL_TABLET | Freq: Two times a day (BID) | ORAL | 0 refills | Status: DC

## 2020-04-07 NOTE — ED Triage Notes (Signed)
L shoulder pain, radiating down her arm. Also reports pain to neck when she looks left. This started yesterday.

## 2020-04-07 NOTE — ED Provider Notes (Signed)
[name] Provider Note   CSN: [id number] Arrival date & time: 04/07/20  [id number]     History Chief Complaint  Patient presents with  . Arm Pain    [name] is a 49 y.o. female   The history is provided by the patient.    Patient is a 49 year old female with no pertinent past medical history presented today for left arm pain, left shoulder pain, neck pain.  She states that she has had issues with her right shoulder in the past however she states that this today is pain in her neck.  Patient states that she uses a shoulder bag because of her left shoulder primarily that she feels may be exacerbating her pain.  She states that the pain is achy, constant since yesterday, worse with movement and touch.  She states that the pain does at times seems to radiate to the back of her left shoulder.  She denies any pleuritic component to the pain or exertional component to the pain.  Denies any aggravating or alleviating factors other than as mentioned above. Patient states that she also has been lifting her niece on her left side frequently.  She states that there are days he has although this is become more of a heavy lifting procedure.   She denies any chest pain or shortness of breath.  Denies any nausea, diaphoresis, headaches, lightheadedness, dizziness.   Patient states that she is a current smoker, states that her grandfather died in early 42s of heart attack.  She denies any history of DM, HTN, HLD, obesity.      Past Medical History:  Diagnosis Date  . Allergy   . Anemia    hx  . Chronic female pelvic pain    improved after hysterectomy  . Cyst of left kidney    rt and lft  . Dry eye syndrome    and eczema  . GERD (gastroesophageal reflux disease)   . History of kidney stones   . History of nephrolithiasis   . History of syncope    during pregnancy    Patient Active Problem List   Diagnosis Date Noted  . Trapezius strain 11/30/2014  .  Migraine 11/16/2014  . Migraines 11/08/2014  . 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 Headache(784.0) 09/01/2012  . Angiomyolipoma of kidney, left 05/05/2012  . Abdominal pain 05/01/2012  . COCCYGEAL PAIN 09/26/2009  . PELVIC  PAIN 05/31/2009  . TOBACCO ABUSE 03/26/2009  . EDEMA 03/26/2009  . WEIGHT GAIN 03/26/2009  . NEPHROLITHIASIS, HX OF 03/26/2009    Past Surgical History:  Procedure Laterality Date  . APPENDECTOMY    . CHOLECYSTECTOMY N/A 05/26/2017   Procedure: LAPAROSCOPIC CHOLECYSTECTOMY;  Surgeon: Coralie Keens, MD;  Location: [facility];  Service: General;  Laterality: N/A;  . COLONOSCOPY  2003   Dr. Isa Rankin  . TONSILLECTOMY    . UPPER GASTROINTESTINAL ENDOSCOPY    . VAGINAL HYSTERECTOMY       OB History   No obstetric history on file.     Family History  Problem Relation Age of Onset  . Uterine cancer Mother   . Colon polyps Father   . Diabetes type II Paternal Grandfather   . Coronary artery disease Paternal Grandfather   . Hyperlipidemia Paternal Grandfather   . Hypertension Other        maternal & Paternal grandfather  . Stomach cancer Other   . Breast cancer Neg Hx   . Colon cancer Neg Hx   . Rectal  cancer Neg Hx   . Pancreatic cancer Neg Hx     Social History   Tobacco Use  . Smoking status: Current Every Day Smoker    Packs/day: 0.50    Years: 10.00    Pack years: 5.00    Types: Cigarettes  . Smokeless tobacco: Never Used  . Tobacco comment: 5 cigarettes daily  Substance Use Topics  . Alcohol use: Yes    Alcohol/week: 0.0 standard drinks    Comment: rarely  . Drug use: No    Home Medications Prior to Admission medications   Medication Sig Start Date End Date Taking? Authorizing Provider  cetirizine (ZYRTEC) 10 MG tablet Take 5 mg by mouth at bedtime.    Yes [provider]  acetaminophen (TYLENOL) 325 MG tablet Take 325 mg by mouth every 6 (six) hours as needed (for pain.).    [provider]  amoxicillin (AMOXIL) 875 MG  tablet Take 1 tablet (875 mg total) by mouth 2 (two) times daily. 09/18/19   McVey, Gelene Mink, PA-C  amoxicillin-clavulanate (AUGMENTIN) 875-125 MG tablet Take 1 tablet by mouth 2 (two) times daily. 05/02/19   Hassell Done, Mary-Margaret, FNP  azelastine (ASTELIN) 0.1 % nasal spray Place into the nose. 10/14/15   [provider]  fluticasone (FLONASE) 50 MCG/ACT nasal spray Place into the nose. 10/14/15   [provider]  ibuprofen (ADVIL,MOTRIN) 200 MG tablet Take 200 mg by mouth every 6 (six) hours as needed.    [provider]  methocarbamol (ROBAXIN) 500 MG tablet Take 1 tablet (500 mg total) by mouth 2 (two) times daily. 04/07/20   Tedd Sias, PA  oseltamivir (TAMIFLU) 75 MG capsule Take 1 capsule (75 mg total) by mouth 2 (two) times daily. 03/06/19   Saguier, [name], PA-C  valACYclovir (VALTREX) 1000 MG tablet TAKE 1 TABLET BY MOUTH TWICE DAILY 09/29/19   Saguier, [name], PA-C    Allergies    Morphine and related  Review of Systems   Review of Systems  Constitutional: Negative for chills and fever.  HENT: Negative for congestion.   Eyes: Negative for pain.  Respiratory: Negative for cough and shortness of breath.   Cardiovascular: Negative for chest pain and leg swelling.  Gastrointestinal: Negative for abdominal distention, abdominal pain and vomiting.  Genitourinary: Negative for dysuria.  Musculoskeletal: Positive for myalgias.       Left sided neck pain, left shoulder pain, left arm numbness and tingling  Skin: Negative for rash.  Neurological: Negative for dizziness and headaches.    Physical Exam Updated Vital Signs BP 115/75 (BP Location: Right Arm)   Pulse 65   Temp 98.8 F (37.1 C) (Oral)   Resp 18   Ht 5\' 1"  (1.549 m)   Wt 68 kg   SpO2 99%   BMI 28.34 kg/m   Physical Exam Vitals and nursing note reviewed.  Constitutional:      General: She is not in acute distress.    Appearance: Normal appearance. She is not ill-appearing.      Comments: Patient is pleasant, 49 year old female in no acute distress sitting comfortably in bed  HENT:     Head: Normocephalic and atraumatic.     Mouth/Throat:     Mouth: Mucous membranes are moist.  Eyes:     General: No scleral icterus.       Right eye: No discharge.        Left eye: No discharge.     Conjunctiva/sclera: Conjunctivae normal.  Neck:  Comments: No step-off or deformity.  Positive Spurling test on the left.  No tenderness palpation of midline spine.  Cardiovascular:     Rate and Rhythm: Normal rate.     Pulses: Normal pulses.     Heart sounds: Normal heart sounds.     Comments: Pulses are 3+ and symmetric bilateral radius, bilateral DP/PT Pulmonary:     Effort: Pulmonary effort is normal.     Breath sounds: No stridor.  Abdominal:     Tenderness: There is no abdominal tenderness. There is no right CVA tenderness, left CVA tenderness, guarding or rebound.  Musculoskeletal:     Cervical back: Normal range of motion and neck supple.  Neurological:     Mental Status: She is alert and oriented to person, place, and time. Mental status is at baseline.     Comments: Alert and oriented to self, place, time and event.   Speech is fluent, clear without dysarthria or dysphasia.   Strength 5/5 in upper/lower extremities  Sensation intact in upper/lower extremities   Normal gait.  Negative Romberg. No pronator drift.  Normal finger-to-nose and feet tapping.  CN I not tested  CN II grossly intact visual fields bilaterally. Did not visualize posterior eye.   CN III, IV, VI PERRLA and EOMs intact bilaterally  CN V Intact sensation to sharp and light touch to the face  CN VII facial movements symmetric  CN VIII not tested  CN IX, X no uvula deviation, symmetric rise of soft palate  CN XI 5/5 SCM and trapezius strength bilaterally  CN XII Midline tongue protrusion, symmetric L/R movements      ED Results / Procedures / Treatments   Labs (all labs ordered are  listed, but only abnormal results are displayed) Labs Reviewed - No data to display  EKG EKG Interpretation  Date/Time:  Sunday April 07 2020 10:32:47 EDT Ventricular Rate:  71 PR Interval:    QRS Duration: 100 QT Interval:  378 QTC Calculation: 411 R Axis:   -55 Text Interpretation: Sinus rhythm LAD, consider left anterior fascicular block Low voltage, precordial leads Abnormal R-wave progression, early transition No significant change since last tracing Confirmed by Deno Etienne [id number]) on 04/07/2020 10:36:43 AM   Radiology DG Cervical Spine Complete  Result Date: 04/07/2020 CLINICAL DATA:  Left shoulder pain radiating down left arm. No injury. Assess disc spaces. EXAM: CERVICAL SPINE - COMPLETE 4+ VIEW COMPARISON:  None. FINDINGS: Vertebral body alignment and heights are normal. There is mild spondylosis over the mid to lower cervical spine. Subtle disc space narrowing at the C6-7 level. Prevertebral soft tissues are normal. No significant neural foraminal narrowing. There is mild uncovertebral joint spurring and facet arthropathy. Atlantoaxial articulation is normal. IMPRESSION: Mild spondylosis of the cervical spine with minimal disc space narrowing at the C6-7 level. No significant neural foraminal narrowing. Electronically Signed   By: Marin Olp M.D.   On: 04/07/2020 10:53    Procedures Procedures (including critical care time)  Medications Ordered in ED Medications - No data to display  ED Course  I have reviewed the triage vital signs and the nursing notes.  Pertinent labs & imaging results that were available during my care of the patient were reviewed by me and considered in my medical decision making (see chart for details).    MDM Rules/Calculators/A&P                      Patient with tenderness to palpation of the left  shoulder.   Physical exam notable for positive Spurling test, tenderness palpation of the left trapezius with multiple trigger points.  Low  suspicion for thoracic aortic dissection, ACS, stroke, or other acute emergent medicine condition.  Given physical exam and history more likely that this is trapezius strain versus possible bulging disks in the C-spine.  I obtained plain x-ray of C-spine which showed possible intervertebral narrowing which may indicate early bulging disc.  No acute fractures.  I discussed these findings imaging physician.  I discussed this case with my attending physician who cosigned this note including patient's presenting symptoms, physical exam, and planned diagnostics and interventions. Attending physician stated agreement with plan or made changes to plan which were implemented.   Attending physician assessed patient at bedside.  More likely this patient is experiencing trapezius strain.  Will recommend Tylenol, ibuprofen PCP for her symptoms.  Return precautions given.  She is well-appearing this time.  Will discharge with return cautions.  Final Clinical Impression(s) / ED Diagnoses Final diagnoses:  Left arm pain  Neck pain    Rx / DC Orders ED Discharge Orders         Ordered    methocarbamol (ROBAXIN) 500 MG tablet  2 times daily     04/07/20 1107           Tedd Sias, [place] 04/07/20 Summers, [place], DO 04/08/20 0809

## 2020-04-07 NOTE — ED Notes (Signed)
Patient transported to X-ray 

## 2020-04-07 NOTE — Discharge Instructions (Addendum)
Please use Tylenol or ibuprofen for pain.  You may use 600 mg ibuprofen every 6 hours or 1000 mg of Tylenol every 6 hours.  You may choose to alternate between the 2.  This would be most effective.  Not to exceed 4 g of Tylenol within 24 hours.  Not to exceed 3200 mg ibuprofen 24 hours.  Please do PT if you are able to arrange this.  Please do gentle stretching, gentle exercises including yoga.  You may also benefit from some deep tissue massage

## 2020-04-08 ENCOUNTER — Telehealth: Payer: Self-pay | Admitting: Medical

## 2020-04-08 DIAGNOSIS — M79602 Pain in left arm: Secondary | ICD-10-CM

## 2020-04-08 NOTE — Telephone Encounter (Signed)
Would you like to see this patient before you place the referral .

## 2020-04-08 NOTE — Telephone Encounter (Signed)
Patient seen in ED over the weekend, patient has a neck pain, per patient ed doctor wants patient to go to physical therapy , patient requesting a referral    Please advise    patient would like to go [facility] # [phone]

## 2020-04-08 NOTE — Telephone Encounter (Signed)
Could refer to physical therapy or sports medicine.  I would lean towards getting sports medicine opinion first and see if they think PT appropiate. Base this on review of ED note and they stated she had positive spurling test. So maybe cervical radicular pain.

## 2020-04-09 NOTE — Telephone Encounter (Signed)
Referral to sports med sent.

## 2020-04-10 ENCOUNTER — Ambulatory Visit: Payer: Self-pay

## 2020-04-10 ENCOUNTER — Telehealth: Payer: Self-pay

## 2020-04-10 ENCOUNTER — Emergency Department (HOSPITAL_COMMUNITY): Payer: 59

## 2020-04-10 ENCOUNTER — Emergency Department (HOSPITAL_COMMUNITY)
Admission: EM | Admit: 2020-04-10 | Discharge: 2020-04-11 | Disposition: A | Discharge status: Home / Self Care | Payer: 59 | Attending: Emergency Medicine | Admitting: Emergency Medicine

## 2020-04-10 ENCOUNTER — Ambulatory Visit (INDEPENDENT_AMBULATORY_CARE_PROVIDER_SITE_OTHER): Payer: 59 | Admitting: Medical

## 2020-04-10 ENCOUNTER — Other Ambulatory Visit: Payer: Self-pay

## 2020-04-10 ENCOUNTER — Ambulatory Visit: Payer: 59 | Admitting: Medical

## 2020-04-10 ENCOUNTER — Ambulatory Visit (INDEPENDENT_AMBULATORY_CARE_PROVIDER_SITE_OTHER): Payer: 59 | Admitting: Family Medicine

## 2020-04-10 ENCOUNTER — Encounter: Payer: Self-pay | Admitting: Family Medicine

## 2020-04-10 ENCOUNTER — Encounter (HOSPITAL_COMMUNITY): Payer: Self-pay

## 2020-04-10 VITALS — BP 122/76 | HR 87 | Ht 61.0 in | Wt 150.0 lb

## 2020-04-10 VITALS — BP 123/59 | HR 85 | Temp 97.1°F | Resp 18 | Ht 61.0 in | Wt 150.6 lb

## 2020-04-10 DIAGNOSIS — R9431 Abnormal electrocardiogram [ECG] [EKG]: Secondary | ICD-10-CM | POA: Diagnosis not present

## 2020-04-10 DIAGNOSIS — R0789 Other chest pain: Secondary | ICD-10-CM | POA: Insufficient documentation

## 2020-04-10 DIAGNOSIS — Z79899 Other long term (current) drug therapy: Secondary | ICD-10-CM | POA: Insufficient documentation

## 2020-04-10 DIAGNOSIS — E876 Hypokalemia: Secondary | ICD-10-CM | POA: Diagnosis not present

## 2020-04-10 DIAGNOSIS — E786 Lipoprotein deficiency: Secondary | ICD-10-CM

## 2020-04-10 DIAGNOSIS — M25512 Pain in left shoulder: Secondary | ICD-10-CM | POA: Diagnosis not present

## 2020-04-10 DIAGNOSIS — R079 Chest pain, unspecified: Secondary | ICD-10-CM | POA: Diagnosis not present

## 2020-04-10 DIAGNOSIS — G8929 Other chronic pain: Secondary | ICD-10-CM

## 2020-04-10 DIAGNOSIS — M5412 Radiculopathy, cervical region: Secondary | ICD-10-CM | POA: Diagnosis not present

## 2020-04-10 DIAGNOSIS — F17200 Nicotine dependence, unspecified, uncomplicated: Secondary | ICD-10-CM | POA: Diagnosis not present

## 2020-04-10 DIAGNOSIS — Z9049 Acquired absence of other specified parts of digestive tract: Secondary | ICD-10-CM | POA: Diagnosis not present

## 2020-04-10 DIAGNOSIS — M7918 Myalgia, other site: Secondary | ICD-10-CM | POA: Diagnosis not present

## 2020-04-10 DIAGNOSIS — F17210 Nicotine dependence, cigarettes, uncomplicated: Secondary | ICD-10-CM | POA: Insufficient documentation

## 2020-04-10 DIAGNOSIS — M898X1 Other specified disorders of bone, shoulder: Secondary | ICD-10-CM | POA: Diagnosis not present

## 2020-04-10 LAB — TROPONIN I (HIGH SENSITIVITY): Troponin I (High Sensitivity): 3 ng/L (ref <18)

## 2020-04-10 LAB — CBC
HCT: 43.2 % (ref 36.0–46.0)
Hemoglobin: 14.3 g/dL (ref 12.0–15.0)
MCH: 31.7 pg (ref 26.0–34.0)
MCHC: 33.1 g/dL (ref 30.0–36.0)
MCV: 95.8 fL (ref 80.0–100.0)
Platelets: 287 10*3/uL (ref 150–400)
RBC: 4.51 MIL/uL (ref 3.87–5.11)
RDW: 12.6 % (ref 11.5–15.5)
WBC: 12.2 10*3/uL — ABNORMAL HIGH (ref 4.0–10.5)
nRBC: 0 % (ref 0.0–0.2)

## 2020-04-10 LAB — BASIC METABOLIC PANEL
Anion gap: 11 (ref 5–15)
BUN: 12 mg/dL (ref 6–20)
CO2: 24 mmol/L (ref 22–32)
Calcium: 9.5 mg/dL (ref 8.9–10.3)
Chloride: 104 mmol/L (ref 98–111)
Creatinine, Ser: 0.79 mg/dL (ref 0.44–1.00)
GFR calc Af Amer: >60 mL/min (ref >60)
GFR calc non Af Amer: >60 mL/min (ref >60)
Glucose, Bld: 124 mg/dL — ABNORMAL HIGH (ref 70–99)
Potassium: 4.3 mmol/L (ref 3.5–5.1)
Sodium: 139 mmol/L (ref 135–145)

## 2020-04-10 LAB — I-STAT BETA HCG BLOOD, ED (MC, WL, AP ONLY): I-stat hCG, quantitative: <5 m[IU]/mL (ref <5)

## 2020-04-10 MED ORDER — NICOTINE 21 MG/24HR TD PT24: 21.0000 mg | MEDICATED_PATCH | Freq: Every day | TRANSDERMAL | 0 refills | Status: DC

## 2020-04-10 MED ORDER — SODIUM CHLORIDE 0.9% FLUSH: 3.0000 mL | Freq: Once | INTRAVENOUS | Status: DC

## 2020-04-10 MED ORDER — METHYLPREDNISOLONE ACETATE 40 MG/ML IJ SUSP
40.0000 mg | Freq: Once | INTRAMUSCULAR | Status: AC
  Administered 2020-04-10: 17:00:00 40 mg via INTRA_ARTICULAR

## 2020-04-10 MED FILL — NICOTINE 21 MG/24HR PATCH: 21 | 28 days supply | Qty: 28 | Fill #0

## 2020-04-10 NOTE — Patient Instructions (Signed)
NIce to meet you Please try heat  Please try the exercises  Physical therapy will give you a call  Please send me a message in MyChart with any questions or updates.  Please see me back in 4 weeks.   --Dr. Raeford Razor

## 2020-04-10 NOTE — Progress Notes (Signed)
Medication Samples have been provided to the patient.  Drug name: Duexis       Strength: 800mg /26.6mg         Qty: 2 Boxes [id number]  Exp.Date: 01/2021  Dosing instructions: Take TID PRN   The patient has been instructed regarding the correct time, dose, and frequency of taking this medication, including desired effects and most common side effects.   [name], MA 5:27 PM 04/10/2020

## 2020-04-10 NOTE — Progress Notes (Signed)
   Subjective:    Patient ID: [name], female    DOB: [birth year], 49 y.o.   MRN: [id number]   HPI   Pt in for follow from ED.  ED opinion.   Patient with tenderness to palpation of the left shoulder.   Physical exam notable for positive Spurling test, tenderness palpation of the left trapezius with multiple trigger points.  Low suspicion for thoracic aortic dissection, ACS, stroke, or other acute emergent medicine condition.  Given physical exam and history more likely that this is trapezius strain versus possible bulging disks in the C-spine.  I obtained plain x-ray of C-spine which showed possible intervertebral narrowing which may indicate early bulging disc.  No acute fractures.  I discussed these findings imaging physician.  I discussed this case with my attending physician who cosigned this note including patient's presenting symptoms, physical exam, and planned diagnostics and interventions. Attending physician stated agreement with plan or made changes to plan which were implemented.   Attending physician assessed patient at bedside.  More likely this patient is experiencing trapezius strain.  Will recommend Tylenol, ibuprofen PCP for her symptoms.  Return precautions given.  She is well-appearing this time.  Will discharge with return cautions.   and just got back from sport medicine appointment. Pt got pennsadi topical, deuxis and got trigger point injection.  Pt had follow up with sport med today> Pt tells me given trigger point injection, pennsaid sample, and duexis. Heat advised, exercises at hom and PT referral placed.  Pt saw note from ED and records about possible anterior fascicular block and has grown increasing concerned about this. She states both grandads in 50-60 yr range had CAD. One died from from MI. Other had quadruple bypass surgery. Pt has history of smoking for 30 years about pack a day.  Pt overall states her scapula and left shoulder area feels  better. She never had chest pain, sob, or jaw pain.     Review of Systems  see hpi    Objective:   Physical Exam  General- No acute distress. Pleasant patient. Neck- Full range of motion, no jvd. Left trapezius tenderness to palpation. Lungs- Clear, even and unlabored. Heart- regular rate and rhythm. Neurologic- CNII- XII grossly intact. Shoulder- good rom. Posterior aspect tender. Scapula- tender superior portion of left scapula.        Assessment & Plan:  For left shoulder, left scapula area, and trapezius pain follow sports med instruction.  For hx of smoking please taper off cigrettes and start nicotine patch.  Future lipid panel and cmp placed. Evaluate risk factors.  If cardiac or associate cardiac symptoms occur pending cardiologist referral then be seen in ED. Unfortunatlye shoulder pain may be presenting symptom but with recent treatment form sports med don't expect that to be present.  Ekg showed possible anterior fascicular block. Lead II, II and avf may represent this.   -  Nonspecific T-abnormality. Will refer to cardiologist as pt has become concerned about CAD due to her FH, recent left shoulder pain and hx of smoking.  Follow up in 2 weeks or as needed  Time spent with patient today was 40   minutes which consisted of chart review, discussing differential diagnosis, work up,  Treatment, referral, smoking cessation  and documentation.

## 2020-04-10 NOTE — Progress Notes (Signed)
Medication Samples have been provided to the patient.  Drug name: Pennsaid       Strength: 2%        Qty: 2 Boxes  [id number]  Exp.Date: 10/2020  Dosing instructions: Use a pea size amount and rub gently.  The patient has been instructed regarding the correct time, dose, and frequency of taking this medication, including desired effects and most common side effects.   [name], [place] 4:58 PM 04/10/2020

## 2020-04-10 NOTE — Progress Notes (Signed)
[name] - 48 y.o. female MRN [id number]  Date of birth: 01-31-1971  SUBJECTIVE:  Including CC & ROS.  Chief Complaint  Patient presents with  . Arm Pain    left arm    [name] is a 49 y.o. female that is presenting with left upper back pain as well as radicular symptoms down the left arm.  She has been having this pain and symptoms for about a year now.  Has acutely gotten worse in.  She works on the computer for several hours a day.  Denies any history of similar pain.  Has not had inciting event or trauma.  Independent review of the cervical spine x-ray from 4/11 shows degenerative disc changes at C6-7.   Review of Systems See HPI   HISTORY: Past Medical, Surgical, Social, and Family History Reviewed & Updated per EMR.   Pertinent Historical Findings include:  Past Medical History:  Diagnosis Date  . Allergy   . Anemia    hx  . Chronic female pelvic pain    improved after hysterectomy  . Cyst of left kidney    rt and lft  . Dry eye syndrome    and eczema  . GERD (gastroesophageal reflux disease)   . History of kidney stones   . History of nephrolithiasis   . History of syncope    during pregnancy    Past Surgical History:  Procedure Laterality Date  . APPENDECTOMY    . CHOLECYSTECTOMY N/A 05/26/2017   Procedure: LAPAROSCOPIC CHOLECYSTECTOMY;  Surgeon: Coralie Keens, MD;  Location: [facility];  Service: General;  Laterality: N/A;  . COLONOSCOPY  2003   Dr. Isa Rankin  . TONSILLECTOMY    . UPPER GASTROINTESTINAL ENDOSCOPY    . VAGINAL HYSTERECTOMY      Family History  Problem Relation Age of Onset  . Uterine cancer Mother   . Colon polyps Father   . Diabetes type II Paternal Grandfather   . Coronary artery disease Paternal Grandfather   . Hyperlipidemia Paternal Grandfather   . Hypertension Other        maternal & Paternal grandfather  . Stomach cancer Other   . Breast cancer Neg Hx   . Colon cancer Neg Hx   . Rectal cancer Neg Hx   . Pancreatic  cancer Neg Hx     Social History   Socioeconomic History  . Marital status: Married    Spouse name: Not on file  . Number of children: Not on file  . Years of education: Not on file  . Highest education level: Not on file  Occupational History  . Not on file  Tobacco Use  . Smoking status: Current Every Day Smoker    Packs/day: 0.50    Years: 10.00    Pack years: 5.00    Types: Cigarettes  . Smokeless tobacco: Never Used  . Tobacco comment: 5 cigarettes daily  Substance and Sexual Activity  . Alcohol use: Yes    Alcohol/week: 0.0 standard drinks    Comment: rarely  . Drug use: No  . Sexual activity: Not on file  Other Topics Concern  . Not on file  Social History Narrative   Education officer, environmental for [employer]   Married 6 years   Children-teenagers   Social Determinants of Health   Financial Resource Strain:   . Difficulty of Paying Living Expenses:   Food Insecurity:   . Worried About Charity fundraiser in the Last Year:   . Ran  Out of Food in the Last Year:   Transportation Needs:   . Lack of Transportation (Medical):   Marland Kitchen Lack of Transportation (Non-Medical):   Physical Activity:   . Days of Exercise per Week:   . Minutes of Exercise per Session:   Stress:   . Feeling of Stress :   Social Connections:   . Frequency of Communication with Friends and Family:   . Frequency of Social Gatherings with Friends and Family:   . Attends Religious Services:   . Active Member of Clubs or Organizations:   . Attends Archivist Meetings:   Marland Kitchen Marital Status:   Intimate Partner Violence:   . Fear of Current or Ex-Partner:   . Emotionally Abused:   Marland Kitchen Physically Abused:   . Sexually Abused:      PHYSICAL EXAM:  VS: BP 122/76   Pulse 87   Ht 5\' 1"  (1.549 m)   Wt 150 lb (68 kg)   BMI 28.34 kg/m  Physical Exam Gen: NAD, alert, cooperative with exam, well-appearing MSK:  Neck: Limited lateral rotation. Normal flexion and extension. Normal strength  resistance with shrug. Left shoulder: Appears to be dysfunction of the left scapula. Multiple trigger points in the trapezius, rhomboids and paracervical muscles. Normal internal and external rotation. Normal strength resistance. Negative empty can test. Negative O'Brien's test. Neurovascular intact  Limited ultrasound: Left shoulder:  Normal-appearing biceps tendon long and short axis. Normal supraspinatus. No effusion in the posterior glenohumeral joint.  Summary: No structural findings appreciated.  Ultrasound and interpretation by Clearance Coots, MD   Aspiration/Injection Procedure Note [name] 1971/09/04  Procedure: Injection Indications: Left trapezius pain and upper back pain/trigger points  Procedure Details Consent: Risks of procedure as well as the alternatives and risks of each were explained to the (patient/caregiver).  Consent for procedure obtained. Time Out: Verified patient identification, verified procedure, site/side was marked, verified correct patient position, special equipment/implants available, medications/allergies/relevent history reviewed, required imaging and test results available.  Performed.  The area was cleaned with iodine and alcohol swabs.    The left trapezius and rhomboid trigger point was injected using 1 cc's of 40 mg Depo-Medrol with and 4 cc's of 0.25% bupivacaine with a 25 1 " needle.    A sterile dressing was applied.  Patient did tolerate procedure well.    ASSESSMENT & PLAN:   Cervical radiculopathy Has some pain down the left arm which could be radicular in nature.  May be related to the abnormal motion of the scapula. -Counseled on home exercise therapy and supportive care. -Provided Duexis samples. -Referral to physical therapy.  Myofascial pain Seems to have a component of scapular dysfunction leading to multiple trigger points within the left upper back and medial aspect of the scapula.  No structural findings  observed on ultrasound of the shoulder -Counseled on home exercise therapy and supportive care. -Pennsaid samples. -Referral to physical therapy. -Could consider further imaging if needed.

## 2020-04-10 NOTE — ED Triage Notes (Signed)
Pt reports that she went to [facility] on Sunday and had EKG done and another one today, went on mychart and saw that it said abnormal EKG, reports having L shoulder since Sunday, that goes down her arm and wants another cardiac workup.

## 2020-04-10 NOTE — Patient Instructions (Addendum)
For left shoulder, left scapula area, and trapezius pain follow sports med instruction.  For hx of smoking please taper off cigrettes and start nicotine patch.  Future lipid panel and cmp placed. Evaluate risk factors.  If cardiac or associate cardiac symptoms occur pending cardiologist referral then be seen in ED. Unfortunatlye shoulder pain may be presenting symptom but with recent treatment form sports med don't expect that to be present.  Ekg showed possible anterior fascicular block. Lead II, II and avf may represent this.   -  Nonspecific T-abnormality. Will refer to cardiologist as pt has become concerned about CAD due to her FH, recent left shoulder pain and hx of smoking.  Follow up in 2 weeks or as needed

## 2020-04-11 DIAGNOSIS — F17210 Nicotine dependence, cigarettes, uncomplicated: Secondary | ICD-10-CM | POA: Diagnosis not present

## 2020-04-11 DIAGNOSIS — Z79899 Other long term (current) drug therapy: Secondary | ICD-10-CM | POA: Diagnosis not present

## 2020-04-11 DIAGNOSIS — R0789 Other chest pain: Secondary | ICD-10-CM | POA: Diagnosis not present

## 2020-04-11 DIAGNOSIS — Z9049 Acquired absence of other specified parts of digestive tract: Secondary | ICD-10-CM | POA: Diagnosis not present

## 2020-04-11 DIAGNOSIS — M5412 Radiculopathy, cervical region: Secondary | ICD-10-CM | POA: Insufficient documentation

## 2020-04-11 DIAGNOSIS — M7918 Myalgia, other site: Secondary | ICD-10-CM | POA: Insufficient documentation

## 2020-04-11 LAB — TROPONIN I (HIGH SENSITIVITY): Troponin I (High Sensitivity): 9 ng/L (ref <18)

## 2020-04-11 MED ORDER — IBUPROFEN 400 MG PO TABS
400.0000 mg | ORAL_TABLET | Freq: Once | ORAL | Status: AC
  Administered 2020-04-11: 06:00:00 400 mg via ORAL
  Filled 2020-04-11: qty 1

## 2020-04-11 NOTE — Assessment & Plan Note (Signed)
Has some pain down the left arm which could be radicular in nature.  May be related to the abnormal motion of the scapula. -Counseled on home exercise therapy and supportive care. -Provided Duexis samples. -Referral to physical therapy.

## 2020-04-11 NOTE — Assessment & Plan Note (Signed)
Seems to have a component of scapular dysfunction leading to multiple trigger points within the left upper back and medial aspect of the scapula.  No structural findings observed on ultrasound of the shoulder -Counseled on home exercise therapy and supportive care. -Pennsaid samples. -Referral to physical therapy. -Could consider further imaging if needed.

## 2020-04-11 NOTE — Discharge Instructions (Signed)
Return if you are having any problems.  Your primary care provider has already put an ambulatory referral to cardiology. If you do not hear from the cardiology office, then call them to set up an appointment.

## 2020-04-11 NOTE — ED Provider Notes (Signed)
[facility] EMERGENCY DEPARTMENT Provider Note   CSN: [id number] Arrival date & time: 04/10/20  2035    History Chief Complaint  Patient presents with  . Chest Pain    [name] is a 49 y.o. female.  The history is provided by the patient.  Chest Pain She has been having intermittent left-sided chest pain for the last several days.  Pain is sharp and only lasts a few seconds before resolving.  There is no associated dyspnea, nausea, diaphoresis.  She has been seeing a sports medicine physician because of pain in her left shoulder and she has noted some occasional radiation of that pain down her left arm with occasional numbness in her left arm.  She denies history of hypertension, diabetes, hyperlipidemia.  She is a cigarette smoker and there is a positive family history of premature coronary atherosclerosis.  He had been seen in her primary care physician's office and in the ED for these pains recently.  She looked at the ECG report and my chart and noted that it said "abnormal ECG".  She is very worried about that so she came in for further evaluation.  Past Medical History:  Diagnosis Date  . Allergy   . Anemia    hx  . Chronic female pelvic pain    improved after hysterectomy  . Cyst of left kidney    rt and lft  . Dry eye syndrome    and eczema  . GERD (gastroesophageal reflux disease)   . History of kidney stones   . History of nephrolithiasis   . History of syncope    during pregnancy    Patient Active Problem List   Diagnosis Date Noted  . Trapezius strain 11/30/2014  . Migraine 11/16/2014  . Migraines 11/08/2014  . 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 Headache(784.0) 09/01/2012  . Angiomyolipoma of kidney, left 05/05/2012  . Abdominal pain 05/01/2012  . COCCYGEAL PAIN 09/26/2009  . PELVIC  PAIN 05/31/2009  . TOBACCO ABUSE 03/26/2009  . EDEMA 03/26/2009  . WEIGHT GAIN 03/26/2009  . NEPHROLITHIASIS, HX OF 03/26/2009    Past Surgical History:   Procedure Laterality Date  . APPENDECTOMY    . CHOLECYSTECTOMY N/A 05/26/2017   Procedure: LAPAROSCOPIC CHOLECYSTECTOMY;  Surgeon: Coralie Keens, MD;  Location: [place];  Service: General;  Laterality: N/A;  . COLONOSCOPY  2003   Dr. Isa Rankin  . TONSILLECTOMY    . UPPER GASTROINTESTINAL ENDOSCOPY    . VAGINAL HYSTERECTOMY       OB History   No obstetric history on file.     Family History  Problem Relation Age of Onset  . Uterine cancer Mother   . Colon polyps Father   . Diabetes type II Paternal Grandfather   . Coronary artery disease Paternal Grandfather   . Hyperlipidemia Paternal Grandfather   . Hypertension Other        maternal & Paternal grandfather  . Stomach cancer Other   . Breast cancer Neg Hx   . Colon cancer Neg Hx   . Rectal cancer Neg Hx   . Pancreatic cancer Neg Hx     Social History   Tobacco Use  . Smoking status: Current Every Day Smoker    Packs/day: 0.50    Years: 10.00    Pack years: 5.00    Types: Cigarettes  . Smokeless tobacco: Never Used  . Tobacco comment: 5 cigarettes daily  Substance Use Topics  . Alcohol use: Yes    Alcohol/week: 0.0 standard  drinks    Comment: rarely  . Drug use: No    Home Medications Prior to Admission medications   Medication Sig Start Date End Date Taking? Authorizing Provider  acetaminophen (TYLENOL) 325 MG tablet Take 325 mg by mouth every 6 (six) hours as needed (for pain.).    [provider]  amoxicillin (AMOXIL) 875 MG tablet Take 1 tablet (875 mg total) by mouth 2 (two) times daily. Patient not taking: Reported on 04/10/2020 09/18/19   McVey, Gelene Mink, PA-C  amoxicillin-clavulanate (AUGMENTIN) 875-125 MG tablet Take 1 tablet by mouth 2 (two) times daily. Patient not taking: Reported on 04/10/2020 05/02/19   Chevis Pretty, FNP  azelastine (ASTELIN) 0.1 % nasal spray Place into the nose. 10/14/15   [provider]  cetirizine (ZYRTEC) 10 MG tablet Take 5 mg by mouth at  bedtime.     [provider]  fluticasone (FLONASE) 50 MCG/ACT nasal spray Place into the nose. 10/14/15   [provider]  ibuprofen (ADVIL,MOTRIN) 200 MG tablet Take 200 mg by mouth every 6 (six) hours as needed.    [provider]  methocarbamol (ROBAXIN) 500 MG tablet Take 1 tablet (500 mg total) by mouth 2 (two) times daily. 04/07/20   Tedd Sias, PA  nicotine (NICODERM CQ - DOSED IN MG/24 HOURS) 21 mg/24hr patch Place 1 patch (21 mg total) onto the skin daily. 04/10/20   Saguier, [name], PA-C  oseltamivir (TAMIFLU) 75 MG capsule Take 1 capsule (75 mg total) by mouth 2 (two) times daily. Patient not taking: Reported on 04/10/2020 03/06/19   Saguier, [name], PA-C  valACYclovir (VALTREX) 1000 MG tablet TAKE 1 TABLET BY MOUTH TWICE DAILY Patient not taking: Reported on 04/10/2020 09/29/19   Saguier, [name], PA-C    Allergies    Morphine and related  Review of Systems   Review of Systems  Cardiovascular: Positive for chest pain.  All other systems reviewed and are negative.   Physical Exam Updated Vital Signs BP (!) 103/50 (BP Location: Right Arm)   Pulse (!) 57   Temp 98 F (36.7 C) (Oral)   Resp 20   SpO2 98%   Physical Exam Vitals and nursing note reviewed.   49 year old female, resting comfortably and in no acute distress. Vital signs are normal. Oxygen saturation is 98%, which is normal. Head is normocephalic and atraumatic. PERRLA, EOMI. Oropharynx is clear. Neck is nontender and supple without adenopathy or JVD. Back is nontender and there is no CVA tenderness. Lungs are clear without rales, wheezes, or rhonchi. Chest is mildly tender in the left upper chest wall. Heart has regular rate and rhythm without murmur. Abdomen is soft, flat, nontender without masses or hepatosplenomegaly and peristalsis is normoactive. Extremities have no cyanosis or edema, full range of motion is present.  There is mild to moderate tenderness to palpation in the  posterior aspect of the left shoulder. Skin is warm and dry without rash. Neurologic: Mental status is normal, cranial nerves are intact, there are no motor or sensory deficits.  ED Results / Procedures / Treatments   Labs (all labs ordered are listed, but only abnormal results are displayed) Labs Reviewed  BASIC METABOLIC PANEL - Abnormal; Notable for the following components:      Result Value   Glucose, Bld 124 (*)    All other components within normal limits  CBC - Abnormal; Notable for the following components:   WBC 12.2 (*)    All other components within normal limits  I-STAT BETA HCG BLOOD, ED ([facility], [facility], [facility] ONLY)  TROPONIN I (HIGH SENSITIVITY)  TROPONIN I (HIGH SENSITIVITY)    EKG EKG Interpretation  Date/Time:  Wednesday April 10 2020 21:32:58 EDT Ventricular Rate:  84 PR Interval:  152 QRS Duration: 72 QT Interval:  356 QTC Calculation: 420 R Axis:   -47 Text Interpretation: Sinus rhythm Left axis deviation Cannot rule out Anterior infarct , age undetermined Abnormal ECG When compared with ECG of 04/07/2020, No significant change was found Confirmed by Delora Fuel ([id number]) on 04/10/2020 11:43:32 PM   Radiology DG Chest 2 View  Result Date: 04/10/2020 CLINICAL DATA:  Chest pain EXAM: CHEST - 2 VIEW COMPARISON:  None. FINDINGS: The heart size and mediastinal contours are within normal limits. Both lungs are clear. The visualized skeletal structures are unremarkable. IMPRESSION: No active cardiopulmonary disease. Electronically Signed   By: Donavan Foil M.D.   On: 04/10/2020 22:12    Procedures Procedures   Medications Ordered in ED Medications  sodium chloride flush (NS) 0.9 % injection 3 mL (has no administration in time range)    ED Course  I have reviewed the triage vital signs and the nursing notes.  Pertinent labs & imaging results that were available during my care of the patient were reviewed by me and considered in my medical decision making (see chart  for details).  Atypical chest pain which seems musculoskeletal.  Heart pathway score is 2 which puts her at low risk for major adverse cardiac events.  ECG is unchanged from recent 1, chest x-ray shows no acute process, troponin is normal x2.  Patient's risk factors related to positive family history and smoking.  She is encouraged to stop smoking.  Because of significant family history, she desires referral to cardiology.  There has already been issued by her primary care provider.  Old records were reviewed confirming recent ED visit for chest pain and evaluation in her primary care physician's office.  MDM Rules/Calculators/A&P  Final Clinical Impression(s) / ED Diagnoses Final diagnoses:  Atypical chest pain    Rx / DC Orders ED Discharge Orders    None       Delora Fuel, [place] [id number]

## 2020-04-12 ENCOUNTER — Ambulatory Visit (INDEPENDENT_AMBULATORY_CARE_PROVIDER_SITE_OTHER): Payer: 59 | Admitting: Cardiology

## 2020-04-12 ENCOUNTER — Other Ambulatory Visit: Payer: Self-pay

## 2020-04-12 ENCOUNTER — Encounter: Payer: Self-pay | Admitting: Cardiology

## 2020-04-12 DIAGNOSIS — R9431 Abnormal electrocardiogram [ECG] [EKG]: Secondary | ICD-10-CM | POA: Diagnosis not present

## 2020-04-12 DIAGNOSIS — R079 Chest pain, unspecified: Secondary | ICD-10-CM

## 2020-04-12 MED ORDER — METOPROLOL TARTRATE 100 MG PO TABS: ORAL_TABLET | ORAL | 0 refills | Status: DC

## 2020-04-12 MED FILL — METOPROLOL TARTRATE 100 MG: 100 | 2 days supply | Qty: 1 | Fill #0

## 2020-04-12 NOTE — Patient Instructions (Signed)
Medication Instructions:  Your physician recommends that you continue on your current medications as directed. Please refer to the Current Medication list given to you today.  *If you need a refill on your cardiac medications before your next appointment, please call your pharmacy*   Lab Work: Your physician recommends that you return for lab work in: TODAY BMP If you have labs (blood work) drawn today and your tests are completely normal, you will receive your results only by: Marland Kitchen MyChart Message (if you have MyChart) OR . A paper copy in the mail If you have any lab test that is abnormal or we need to change your treatment, we will call you to review the results.   Testing/Procedures: Your physician has requested that you have an echocardiogram. Echocardiography is a painless test that uses sound waves to create images of your heart. It provides your doctor with information about the size and shape of your heart and how well your heart's chambers and valves are working. This procedure takes approximately one hour. There are no restrictions for this procedure.  Your cardiac CT will be scheduled at one of the below locations:   [facility] [address] [place], [place] [address] [phone]   If scheduled at [facility], please arrive at the [facility] main entrance of [facility] 30 minutes prior to test start time. Proceed to the [facility] Radiology Department (first floor) to check-in and test prep.  Please follow these instructions carefully (unless otherwise directed):  On the Night Before the Test: . Be sure to Drink plenty of water. . Do not consume any caffeinated/decaffeinated beverages or chocolate 12 hours prior to your test. . Do not take any antihistamines 12 hours prior to your test.  On the Day of the Test: . Drink plenty of water. Do not drink any water within one hour of the test. . Do not eat any food 4 hours prior to the  test. . You may take your regular medications prior to the test.  . Take metoprolol (Lopressor) two hours prior to test. . FEMALES- please wear underwire-free bra if available       After the Test: . Drink plenty of water. . After receiving IV contrast, you may experience a mild flushed feeling. This is normal. . On occasion, you may experience a mild rash up to 24 hours after the test. This is not dangerous. If this occurs, you can take Benadryl 25 mg and increase your fluid intake. . If you experience trouble breathing, this can be serious. If it is severe call 911 IMMEDIATELY. If it is mild, please call our office. . If you take any of these medications: Glipizide/Metformin, Avandament, Glucavance, please do not take 48 hours after completing test unless otherwise instructed.   Once we have confirmed authorization from your insurance company, we will call you to set up a date and time for your test.   For non-scheduling related questions, please contact the cardiac imaging nurse navigator should you have any questions/concerns: Marchia Bond, RN Navigator Cardiac Imaging [facility] [phone] office  For scheduling needs, including cancellations and rescheduling, please call [phone].      Follow-Up: At [facility], you and your health needs are our priority.  As part of our continuing mission to provide you with exceptional heart care, we have created designated Provider Care Teams.  These Care Teams include your primary Cardiologist (physician) and Advanced Practice Providers (APPs -  Physician Assistants and Nurse Practitioners) who all work together to provide you with the care you need, when you need it.  We recommend signing up for the patient portal called "MyChart".  Sign up information is provided on this After Visit Summary.  MyChart is used to connect with patients for Virtual Visits (Telemedicine).  Patients are able to view lab/test  results, encounter notes, upcoming appointments, etc.  Non-urgent messages can be sent to your provider as well.   To learn more about what you can do with MyChart, go to [email or web address].    Your next appointment:   6 week(s)  The format for your next appointment:   In Person  Provider:   Shirlee More, MD   Other Instructions

## 2020-04-12 NOTE — Progress Notes (Signed)
Cardiology Office Note:    Date:  04/12/2020   ID:  [name], [name] 01-20-1971, MRN [id number]  PCP:  Mackie Pai, PA-C  Cardiologist:  Shirlee More, MD   Referring MD: Mackie Pai, PA-C  ASSESSMENT:    1. Abnormal EKG   2. Chest pain of uncertain etiology   3. Abnormal electrocardiogram    PLAN:    In order of problems listed above:  1. Check echocardiogram regarding seeding anterior septal MI 2. Cardiac CTA regarding CAD  Next appointment 6 weeks   Medication Adjustments/Labs and Tests Ordered: Current medicines are reviewed at length with the patient today.  Concerns regarding medicines are outlined above.  No orders of the defined types were placed in this encounter.  No orders of the defined types were placed in this encounter.    Chief Complaint  Patient presents with  . New Patient (Initial Visit)    History of Present Illness:    [name] is a 49 y.o. female current cigarette smoker who is being seen today for the evaluation of EKG abnormality and chest pain at the request of [place], [name], [place].  She has had 2 recent ED visits with chest and left upper extremity pain and her concerns of heart disease. Her EKG yesterday I personally reviewed showed sinus rhythm left axis deviation poor R wave progression I do not see a pattern of previous infarction.  In office EKG yesterday shows right axis deviation and similar pattern.  EKG 04/07/2020 is similar but more normal.  In summary these are nonspecific EKG changes and similar to a baseline EKG from May 2018 I independently reviewed all of these..  High-sensitivity troponins have been normal.  1 d ago  (04/11/20) 2 d ago  (04/10/20)   Troponin I (High Sensitivity) <18 ng/L 9  3 CM   Also had a chest x-ray yesterday read as normal.  CT abdomen pelvis 2018 showed no vascular calcification or atherosclerosis.  An echocardiogram was performed September 2013 normal left ventricular size and function  otherwise normal.  She is left unsettled after the emergency room visit understand she did not have acute coronary syndrome but is without a diagnosis and concern regarding her EKG.  She does not have typical exertional angina shortness of breath palpitation or syncope.  She is having left shoulder and scapular pain that is rating down the left arm?  Brachial plexopathy.  She is involved in physical therapy.  At times it radiates into the left upper chest around the shoulder stabbing in nature.  She has no known history of congenital rheumatic heart disease the symptoms are nonexertional are not relieved with rest and they lasted for 12 to 24 hours prior to her emergency room evaluation.  She is at increased cardiovascular risk with cigarette smoking and age we reviewed modalities for further evaluation and showed an echocardiogram done to exclude previous anterior septal MI possible an EKG and be set up for cardiac CTA to define the presence or absence of coronary disease calcium score to decide whether she would benefit from lipid-lowering treatment.  He is comfortable with this approach she understands she needs to stop smoking.  Past Medical History:  Diagnosis Date  . Allergy   . Anemia    hx  . Chronic female pelvic pain    improved after hysterectomy  . Cyst of left kidney    rt and lft  . Dry eye syndrome    and eczema  . GERD (gastroesophageal  reflux disease)   . History of kidney stones   . History of nephrolithiasis   . History of syncope    during pregnancy    Past Surgical History:  Procedure Laterality Date  . APPENDECTOMY    . CHOLECYSTECTOMY N/A 05/26/2017   Procedure: LAPAROSCOPIC CHOLECYSTECTOMY;  Surgeon: Coralie Keens, MD;  Location: [facility];  Service: General;  Laterality: N/A;  . COLONOSCOPY  2003   Dr. Isa Rankin  . TONSILLECTOMY    . UPPER GASTROINTESTINAL ENDOSCOPY    . VAGINAL HYSTERECTOMY      Current Medications: Current Meds  Medication Sig  .  cetirizine (ZYRTEC) 10 MG tablet Take 10 mg by mouth at bedtime.   . nicotine (NICODERM CQ - DOSED IN MG/24 HOURS) 21 mg/24hr patch Place 1 patch (21 mg total) onto the skin daily.     Allergies:   Morphine and related   Social History   Socioeconomic History  . Marital status: Married    Spouse name: Not on file  . Number of children: Not on file  . Years of education: Not on file  . Highest education level: Not on file  Occupational History  . Not on file  Tobacco Use  . Smoking status: Current Every Day Smoker    Packs/day: 0.50    Years: 10.00    Pack years: 5.00    Types: Cigarettes  . Smokeless tobacco: Never Used  . Tobacco comment: 5 cigarettes daily  Substance and Sexual Activity  . Alcohol use: Yes    Alcohol/week: 0.0 standard drinks    Comment: rarely  . Drug use: No  . Sexual activity: Not on file  Other Topics Concern  . Not on file  Social History Narrative   Education officer, environmental for [employer]   Married 6 years   Children-teenagers   Social Determinants of Health   Financial Resource Strain:   . Difficulty of Paying Living Expenses:   Food Insecurity:   . Worried About Charity fundraiser in the Last Year:   . Arboriculturist in the Last Year:   Transportation Needs:   . Film/video editor (Medical):   Marland Kitchen Lack of Transportation (Non-Medical):   Physical Activity:   . Days of Exercise per Week:   . Minutes of Exercise per Session:   Stress:   . Feeling of Stress :   Social Connections:   . Frequency of Communication with Friends and Family:   . Frequency of Social Gatherings with Friends and Family:   . Attends Religious Services:   . Active Member of Clubs or Organizations:   . Attends Archivist Meetings:   Marland Kitchen Marital Status:      Family History: The patient's family history includes Colon polyps in her father; Coronary artery disease in her paternal grandfather; Diabetes type II in her paternal grandfather; Hyperlipidemia in her  paternal grandfather; Hypertension in an other family member; Stomach cancer in an other family member; Uterine cancer in her mother. There is no history of Breast cancer, Colon cancer, Rectal cancer, or Pancreatic cancer.  ROS:   Review of Systems  Constitution: Negative.  HENT: Negative.   Eyes: Negative.   Cardiovascular: Positive for chest pain.  Respiratory: Negative.   Endocrine: Negative.   Hematologic/Lymphatic: Negative.   Skin: Negative.   Musculoskeletal: Positive for neck pain.  Gastrointestinal: Negative.   Genitourinary: Negative.   Neurological: Negative.   Psychiatric/Behavioral: The patient is nervous/anxious.   Allergic/Immunologic: Negative.  Please see the history of present illness.     All other systems reviewed and are negative.  EKGs/Labs/Other Studies Reviewed:    The following studies were reviewed today:     Recent Labs: 04/10/2020: BUN 12; Creatinine, Ser 0.79; Hemoglobin 14.3; Platelets 287; Potassium 4.3; Sodium 139  Recent Lipid Panel    Component Value Date/Time   CHOL 136 10/09/2016 0850   TRIG 63.0 10/09/2016 0850   HDL 45.90 10/09/2016 0850   CHOLHDL 3 10/09/2016 0850   VLDL 12.6 10/09/2016 0850   LDLCALC 78 10/09/2016 0850    Physical Exam:    VS:  BP 120/76   Pulse 78   Ht 5\' 1"  (1.549 m)   Wt 150 lb (68 kg)   SpO2 97%   BMI 28.34 kg/m     Wt Readings from Last 3 Encounters:  04/12/20 150 lb (68 kg)  04/10/20 150 lb 9.6 oz (68.3 kg)  04/10/20 150 lb (68 kg)     GEN:  Well nourished, well developed in no acute distress HEENT: Normal NECK: No JVD; No carotid bruits LYMPHATICS: No lymphadenopathy CARDIAC: RRR, no murmurs, rubs, gallops RESPIRATORY:  Clear to auscultation without rales, wheezing or rhonchi  ABDOMEN: Soft, non-tender, non-distended MUSCULOSKELETAL:  No edema; No deformity  SKIN: Warm and dry NEUROLOGIC:  Alert and oriented x 3 PSYCHIATRIC:  Normal affect     Signed, Shirlee More, MD  04/12/2020  4:00 PM    [facility]

## 2020-04-13 LAB — BASIC METABOLIC PANEL
BUN/Creatinine Ratio: 16 (ref 9–23)
BUN: 12 mg/dL (ref 6–24)
CO2: 22 mmol/L (ref 20–29)
Calcium: 9.6 mg/dL (ref 8.7–10.2)
Chloride: 102 mmol/L (ref 96–106)
Creatinine, Ser: 0.76 mg/dL (ref 0.57–1.00)
GFR calc Af Amer: 106 mL/min/{1.73_m2} (ref >59)
GFR calc non Af Amer: 92 mL/min/{1.73_m2} (ref >59)
Glucose: 87 mg/dL (ref 65–99)
Potassium: 4 mmol/L (ref 3.5–5.2)
Sodium: 136 mmol/L (ref 134–144)

## 2020-04-15 ENCOUNTER — Ambulatory Visit (HOSPITAL_BASED_OUTPATIENT_CLINIC_OR_DEPARTMENT_OTHER)
Admission: RE | Admit: 2020-04-15 | Discharge: 2020-04-15 | Disposition: A | Discharge status: Home / Self Care | Payer: 59 | Source: Ambulatory Visit | Attending: Cardiology | Admitting: Cardiology

## 2020-04-15 ENCOUNTER — Other Ambulatory Visit: Payer: Self-pay

## 2020-04-15 ENCOUNTER — Telehealth: Payer: Self-pay

## 2020-04-15 DIAGNOSIS — R9431 Abnormal electrocardiogram [ECG] [EKG]: Secondary | ICD-10-CM | POA: Insufficient documentation

## 2020-04-15 DIAGNOSIS — R079 Chest pain, unspecified: Secondary | ICD-10-CM | POA: Diagnosis not present

## 2020-04-15 NOTE — Telephone Encounter (Signed)
-----   Message from Richardo Priest, MD sent at 04/14/2020  8:15 PM EDT ----- Normal or stable result  For cardiac CTA

## 2020-04-15 NOTE — Progress Notes (Signed)
  Echocardiogram 2D Echocardiogram has been performed.  Cardell Peach 04/15/2020, 2:45 PM

## 2020-04-15 NOTE — Telephone Encounter (Signed)
Spoke with patient regarding results.  Patient verbalizes understanding and is agreeable to plan of care. Advised patient to call back with any issues or concerns.  

## 2020-04-17 ENCOUNTER — Other Ambulatory Visit: Payer: Self-pay

## 2020-04-17 ENCOUNTER — Encounter: Payer: Self-pay | Admitting: Physical Therapy

## 2020-04-17 ENCOUNTER — Ambulatory Visit: Payer: 59 | Attending: Family Medicine | Admitting: Physical Therapy

## 2020-04-17 DIAGNOSIS — M5412 Radiculopathy, cervical region: Secondary | ICD-10-CM | POA: Diagnosis not present

## 2020-04-17 DIAGNOSIS — M6281 Muscle weakness (generalized): Secondary | ICD-10-CM | POA: Diagnosis not present

## 2020-04-17 DIAGNOSIS — R209 Unspecified disturbances of skin sensation: Secondary | ICD-10-CM | POA: Insufficient documentation

## 2020-04-17 DIAGNOSIS — M542 Cervicalgia: Secondary | ICD-10-CM | POA: Insufficient documentation

## 2020-04-17 NOTE — Therapy (Signed)
[place], [place], [address] Phone: [phone]   Fax:  [phone]  Physical Therapy Evaluation  Patient Details  Name: [name] MRN: [id number] Date of Birth: 1971-02-06 Referring Provider (PT): Dr. Clearance Coots   Encounter Date: 04/17/2020  PT End of Session - 04/17/20 0936    Visit Number  1    Number of Visits  12    Date for PT Re-Evaluation  05/31/20    PT Start Time  0916    PT Stop Time  1002    PT Time Calculation (min)  46 min    Activity Tolerance  Patient tolerated treatment well    Behavior During Therapy  [facility] for tasks assessed/performed       Past Medical History:  Diagnosis Date  . Allergy   . Anemia    hx  . Chronic female pelvic pain    improved after hysterectomy  . Cyst of left kidney    rt and lft  . Dry eye syndrome    and eczema  . GERD (gastroesophageal reflux disease)   . History of kidney stones   . History of nephrolithiasis   . History of syncope    during pregnancy    Past Surgical History:  Procedure Laterality Date  . APPENDECTOMY    . CHOLECYSTECTOMY N/A 05/26/2017   Procedure: LAPAROSCOPIC CHOLECYSTECTOMY;  Surgeon: Coralie Keens, MD;  Location: [facility];  Service: General;  Laterality: N/A;  . COLONOSCOPY  2003   Dr. Isa Rankin  . TONSILLECTOMY    . UPPER GASTROINTESTINAL ENDOSCOPY    . VAGINAL HYSTERECTOMY      There were no vitals filed for this visit.   Subjective Assessment - 04/17/20 0924    Subjective  Bahati has had chronic L shoulder pain for >1 yr.  Began to have increased pain >3 mos in neck, shoulder pain and L arm pain about 1 week ago she was carrying , lifting and noticed increased arm pain. . Sensory changes have gone away, has it rarely now.  She did have injections which helped as well as KT tape. She has a sense of weakness in her LLE associated with pain.    Pertinent History  recent cardiac issues, still being worked up    Limitations  Lifting;House hold activities    Diagnostic tests  XR done and showed mild spondylosis C6-C7    Patient Stated Goals  Pain relief    Currently in Pain?  Yes    Pain Score  2    neck when turning   Pain Location  Arm    Pain Orientation  Left;Mid;Lateral    Pain Descriptors / Indicators  Aching;Tightness    Pain Type  Acute pain    Pain Onset  More than a month ago    Pain Frequency  Intermittent    Aggravating Factors   reaching back with L UE , lifting, carrying    Pain Relieving Factors  rest, heat, meds    Effect of Pain on Daily Activities  stressed out, worried about increasing pain or damaging neck    Multiple Pain Sites  No         [facility] PT Assessment - 04/17/20 0001      Assessment   Medical Diagnosis  cervical radiculopathy, myofascial pain     Referring Provider (PT)  Dr. Clearance Coots    Onset Date/Surgical Date  --   acute on chronic 10 day ago  Hand Dominance  Right    Prior Therapy  No       Precautions   Precautions  None      Restrictions   Weight Bearing Restrictions  No      Balance Screen   Has the patient fallen in the past 6 months  No      [place] residence    Additional Comments  has modifed her desk for more support       Prior Function   Level of Independence  Independent    Vocation  Full time employment    Vocation Requirements  works from home , [employer] team, very busy    Leisure  8 mo granddaughter, kids, tries to do more yoga and activity       Cognition   Overall Cognitive Status  Within Functional Limits for tasks assessed      Observation/Other Assessments   Focus on Therapeutic Outcomes (FOTO)   40%      Sensation   Additional Comments  abnormal per patient reports LUE       Coordination   Gross Motor Movements are Fluid and Coordinated  Not tested      Posture/Postural Control   Posture/Postural Control  Postural limitations    Postural Limitations  Rounded  Shoulders;Forward head      AROM   Left Shoulder Flexion  153 Degrees    Left Shoulder ABduction  115 Degrees   pain    Left Shoulder Internal Rotation  --   pain, lumbar    Left Shoulder External Rotation  --   pain, tight end range    Cervical Flexion  45    Cervical Extension  65    Cervical - Right Side Bend  25    Cervical - Left Side Bend  25   p   Cervical - Right Rotation  70    Cervical - Left Rotation  65   pain     PROM   Overall PROM Comments  pain end range ER and abduction (nerve tension)       Strength   Right Shoulder Flexion  4+/5    Right Shoulder ABduction  4+/5    Left Shoulder Flexion  3/5    Left Shoulder ABduction  3/5    Right Elbow Flexion  4+/5    Right Elbow Extension  4/5    Left Elbow Flexion  4/5    Left Elbow Extension  4/5    Right Hand Grip (lbs)  40     Left Hand Grip (lbs)  25      Palpation   Spinal mobility  stiff with lateral glides to R     Palpation comment  pain in L superior aspect shoulder, bilateral post. lateral cervicals and along suboccipital region .  Reports a few days ago she could barely tolerate pressure to L scapular region      Special Tests   Other special tests  Neg [place]          Objective measurements completed on examination: See above findings.      [facility] Adult PT Treatment/Exercise - 04/17/20 0001      Manual Therapy   Manual therapy comments  lateral glide along cervicals for assessment then light treatment     Soft tissue mobilization  upper traps, levator scapuale    Passive ROM  rotation and lateral flexion bilateral     Manual Traction  gentle subocc.release       Neck Exercises: Stretches   Corner Stretch  3 reps;30 seconds        PT Education - 04/17/20 1924    Education Details  PT/POC, HEP, posture, stress and cervical radiculopathy vs trigger point    Person(s) Educated  Patient    Methods  Explanation;Demonstration;Handout;Tactile cues;Verbal cues    Comprehension  Verbalized  understanding;Returned demonstration          PT Long Term Goals - 04/17/20 1927      PT LONG TERM GOAL #1   Title  Pt will be I with HEP for posture and strength, cervical ROM    Time  6    Period  Weeks    Status  New    Target Date  05/31/20      PT LONG TERM GOAL #2   Title  FOTO score will improve to 27% or better to demo improved functional mobility.    Time  6    Period  Weeks    Status  New    Target Date  05/31/20      PT LONG TERM GOAL #3   Title  Pt will be able to take frequent breaks at work to reduce neck tension and modify workspace    Time  6    Period  Weeks    Status  New    Target Date  05/31/20      PT LONG TERM GOAL #4   Title  Pt will be able to carry light objects, grocery bags, purse with no increase in pain in neck, UE    Time  6    Period  Weeks    Status  New    Target Date  05/31/20      PT LONG TERM GOAL #5   Title  Pt will be able to increase L UE strength to 4+/5 or more for maximal shoulder function and stability    Time  6    Period  Weeks    Status  New    Target Date  05/31/20            Plan - 04/17/20 1948    Personal Factors and Comorbidities  Comorbidity 1;Comorbidity 2;Behavior Pattern;Profession;Time since onset of injury/illness/exacerbation    Comorbidities  cardiac, cervical spondyosis    Examination-Activity Limitations  Reach Overhead;Bed Mobility;Carry;Lift;Hygiene/Grooming    Examination-Participation Restrictions  Community Activity;Other;Interpersonal Relationship    Stability/Clinical Decision Making  Evolving/Moderate complexity    Clinical Decision Making  Moderate    Rehab Potential  Excellent    PT Frequency  2x / week    PT Duration  6 weeks    PT Treatment/Interventions  ADLs/Self Care Home Management;Electrical Stimulation;Cryotherapy;Iontophoresis 4mg /ml Dexamethasone;Moist Heat;Traction;Ultrasound;Therapeutic activities;Therapeutic exercise;Patient/family education;Manual techniques;Taping;Dry  needling;Neuromuscular re-education;Passive range of motion;Spinal Manipulations    PT Next Visit Plan  check HEP, posture, manual and consider traction , DN    PT Home Exercise Plan  [id number]    Consulted and Agree with Plan of Care  Patient       Patient will benefit from skilled therapeutic intervention in order to improve the following deficits and impairments:  Decreased mobility, Hypomobility, Increased muscle spasms, Impaired sensation, Decreased range of motion, Decreased strength, Increased fascial restricitons, Impaired flexibility, Impaired UE functional use, Postural dysfunction, Pain  Visit Diagnosis: Cervicalgia  Radiculopathy, cervical region  Muscle weakness (generalized)  Sensory disturbance     Problem List Patient Active Problem List   Diagnosis  Date Noted  . Abnormal EKG 04/12/2020  . Chest pain of uncertain etiology [id number]  . Myofascial pain 04/11/2020  . Cervical radiculopathy 04/11/2020  . Trapezius strain 11/30/2014  . Migraine 11/16/2014  . Migraines 11/08/2014  . 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 Headache(784.0) 09/01/2012  . Angiomyolipoma of kidney, left 05/05/2012  . Abdominal pain 05/01/2012  . COCCYGEAL PAIN 09/26/2009  . PELVIC  PAIN 05/31/2009  . TOBACCO ABUSE 03/26/2009  . EDEMA 03/26/2009  . WEIGHT GAIN 03/26/2009  . NEPHROLITHIASIS, HX OF 03/26/2009    Tyvion Edmondson 04/17/2020, 7:59 PM  [facility] [address] [place], [place], [address] Phone: [phone]   Fax:  [phone]  Name: DAMALI BAGLIO MRN: [id number] Date of Birth: [birth year]   Raeford Razor, PT 04/17/20 7:59 PM Phone: [phone] Fax: [phone]

## 2020-04-20 ENCOUNTER — Encounter: Payer: Self-pay | Admitting: Cardiology

## 2020-04-30 ENCOUNTER — Other Ambulatory Visit: Payer: Self-pay

## 2020-04-30 ENCOUNTER — Encounter: Payer: Self-pay | Admitting: Physical Therapy

## 2020-04-30 ENCOUNTER — Ambulatory Visit: Payer: 59 | Attending: Family Medicine | Admitting: Physical Therapy

## 2020-04-30 DIAGNOSIS — R209 Unspecified disturbances of skin sensation: Secondary | ICD-10-CM | POA: Diagnosis not present

## 2020-04-30 DIAGNOSIS — M6281 Muscle weakness (generalized): Secondary | ICD-10-CM | POA: Insufficient documentation

## 2020-04-30 DIAGNOSIS — M5412 Radiculopathy, cervical region: Secondary | ICD-10-CM | POA: Insufficient documentation

## 2020-04-30 DIAGNOSIS — M542 Cervicalgia: Secondary | ICD-10-CM | POA: Diagnosis not present

## 2020-04-30 NOTE — Therapy (Signed)
[place], [place], [address] Phone: [phone]   Fax:  [phone]  Physical Therapy Treatment  Patient Details  Name: [name] MRN: [id number] Date of Birth: June 19, 1971 Referring Provider (PT): Dr. Clearance Coots   Encounter Date: 04/30/2020  PT End of Session - 04/30/20 0841    Visit Number  2    Number of Visits  12    Date for PT Re-Evaluation  05/31/20    PT Start Time  0833    PT Stop Time  0918    PT Time Calculation (min)  45 min    Activity Tolerance  Patient tolerated treatment well    Behavior During Therapy  [facility] for tasks assessed/performed       Past Medical History:  Diagnosis Date  . Allergy   . Anemia    hx  . Chronic female pelvic pain    improved after hysterectomy  . Cyst of left kidney    rt and lft  . Dry eye syndrome    and eczema  . GERD (gastroesophageal reflux disease)   . History of kidney stones   . History of nephrolithiasis   . History of syncope    during pregnancy    Past Surgical History:  Procedure Laterality Date  . APPENDECTOMY    . CHOLECYSTECTOMY N/A 05/26/2017   Procedure: LAPAROSCOPIC CHOLECYSTECTOMY;  Surgeon: Coralie Keens, MD;  Location: [place];  Service: General;  Laterality: N/A;  . COLONOSCOPY  2003   Dr. Isa Rankin  . TONSILLECTOMY    . UPPER GASTROINTESTINAL ENDOSCOPY    . VAGINAL HYSTERECTOMY      There were no vitals filed for this visit.  Subjective Assessment - 04/30/20 0839    Subjective  Only has pain when moving L arm in a certain way.  Shooting pain in L triceps.  Neck not as stiff.  Numbness rare in L hand.    Currently in Pain?  No/denies         [facility] Adult PT Treatment/Exercise - 04/30/20 0001      Neck Exercises: Machines for Strengthening   UBE (Upper Arm Bike)  5 min L1 in reverse      Shoulder Exercises: Supine   Horizontal ABduction  Strengthening;Both;10 reps    Theraband Level (Shoulder Horizontal  ABduction)  Level 2 (Red)    External Rotation  Strengthening;Both;10 reps    Theraband Level (Shoulder External Rotation)  Level 2 (Red)    Other Supine Exercises  narrow grip red band x 10       Shoulder Exercises: Stretch   Cross Chest Stretch  2 reps;30 seconds      Manual Therapy   Soft tissue mobilization  seated upper trap, levator    Manual Traction  gentle subocc.release       Neck Exercises: Stretches   Upper Trapezius Stretch  2 reps;30 seconds    Levator Stretch  2 reps;30 seconds    Corner Stretch  3 reps;30 seconds             PT Education - 04/30/20 0901    Education Details  HEP, posture, soft tissue work    Forensic psychologist) Educated  Patient    Methods  Explanation;Handout    Comprehension  Verbalized understanding;Returned demonstration          PT Long Term Goals - 04/30/20 1016      PT LONG TERM GOAL #1   Title  Pt will be I  with HEP for posture and strength, cervical ROM    Status  On-going      PT LONG TERM GOAL #2   Title  FOTO score will improve to 27% or better to demo improved functional mobility.    Status  On-going      PT LONG TERM GOAL #3   Title  Pt will be able to take frequent breaks at work to reduce neck tension and modify workspace    Status  Achieved      PT LONG TERM GOAL #4   Title  Pt will be able to carry light objects, grocery bags, purse with no increase in pain in neck, UE    Status  On-going      PT LONG TERM GOAL #5   Title  Pt will be able to increase L UE strength to 4+/5 or more for maximal shoulder function and stability    Status  On-going            Plan - 04/30/20 1016    Clinical Impression Statement  Despite minimal structured HEP she is improving.  She showed increased neural tension along LUE with exercises.  Soft tissue to L upper quarter (min on R side) reduced tension post session. Multiple areas of trigger points, knots, referred pain in L forearm and triceps. Plans to do her HEP more regularly.     PT Treatment/Interventions  ADLs/Self Care Home Management;Electrical Stimulation;Cryotherapy;Iontophoresis 4mg /ml Dexamethasone;Moist Heat;Traction;Ultrasound;Therapeutic activities;Therapeutic exercise;Patient/family education;Manual techniques;Taping;Dry needling;Neuromuscular re-education;Passive range of motion;Spinal Manipulations    PT Next Visit Plan  check HEP, posture, manual and consider traction , DN    PT Home Exercise Plan  [id number]: corner stretch, rhomboid, chin tuck, lateral flexion (cervical) and rotation.  Added red band for supine scap stab today.       Patient will benefit from skilled therapeutic intervention in order to improve the following deficits and impairments:  Decreased mobility, Hypomobility, Increased muscle spasms, Impaired sensation, Decreased range of motion, Decreased strength, Increased fascial restricitons, Impaired flexibility, Impaired UE functional use, Postural dysfunction, Pain  Visit Diagnosis: Cervicalgia  Radiculopathy, cervical region  Muscle weakness (generalized)  Sensory disturbance     Problem List Patient Active Problem List   Diagnosis Date Noted  . Abnormal EKG 04/12/2020  . Chest pain of uncertain etiology A999333  . Myofascial pain 04/11/2020  . Cervical radiculopathy 04/11/2020  . Trapezius strain 11/30/2014  . Migraine 11/16/2014  . Migraines 11/08/2014  . 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 Headache(784.0) 09/01/2012  . Angiomyolipoma of kidney, left 05/05/2012  . Abdominal pain 05/01/2012  . COCCYGEAL PAIN 09/26/2009  . PELVIC  PAIN 05/31/2009  . TOBACCO ABUSE 03/26/2009  . EDEMA 03/26/2009  . WEIGHT GAIN 03/26/2009  . NEPHROLITHIASIS, HX OF 03/26/2009    PAA,JENNIFER 04/30/2020, 10:19 AM  [place], [place], [address] Phone: [phone]   Fax:  [phone]  Name: [name] MRN: [id number] Date of Birth: 1971/12/11  Raeford Razor,  PT 04/30/20 10:20 AM Phone: [phone] Fax: [phone]

## 2020-04-30 NOTE — Patient Instructions (Signed)
Over Head Pull: Narrow Grip       On back, knees bent, feet flat, band across thighs, elbows straight but relaxed. Pull hands apart (start). Keeping elbows straight, bring arms up and over head, hands toward floor. Keep pull steady on band. Hold momentarily. Return slowly, keeping pull steady, back to start. Repeat __10_ times. Band color ____RED__   Side Pull: Double Arm   On back, knees bent, feet flat. Arms perpendicular to body, shoulder level, elbows straight but relaxed. Pull arms out to sides, elbows straight. Resistance band comes across collarbones, hands toward floor. Hold momentarily. Slowly return to starting position. Repeat _10__ times. Band color _____RED   Sash   On back, knees bent, feet flat, left hand on left hip, right hand above left. Pull right arm DIAGONALLY (hip to shoulder) across chest. Bring right arm along head toward floor. Hold momentarily. Slowly return to starting position. Repeat __10_ times. Do with left arm. Band color __RED____   Shoulder Rotation: Double Arm   On back, knees bent, feet flat, elbows tucked at sides, bent 90, hands palms up. Pull hands apart and down toward floor, keeping elbows near sides. Hold momentarily. Slowly return to starting position. Repeat __10_ times. Band color ____RED__

## 2020-05-02 ENCOUNTER — Ambulatory Visit: Payer: 59 | Admitting: Physical Therapy

## 2020-05-03 ENCOUNTER — Telehealth (HOSPITAL_COMMUNITY): Payer: Self-pay | Admitting: *Deleted

## 2020-05-03 NOTE — Telephone Encounter (Signed)
Attempted to call patient regarding upcoming cardiac CT appointment. Left message on voicemail with name and callback number Rashmi Tallent Tai RN Navigator Cardiac Imaging [facility] [phone] Office [phone] Cell 

## 2020-05-06 ENCOUNTER — Encounter: Payer: Self-pay | Admitting: Physical Therapy

## 2020-05-06 ENCOUNTER — Other Ambulatory Visit: Payer: Self-pay

## 2020-05-06 ENCOUNTER — Ambulatory Visit (HOSPITAL_COMMUNITY)
Admission: RE | Admit: 2020-05-06 | Discharge: 2020-05-06 | Disposition: A | Discharge status: Home / Self Care | Payer: 59 | Source: Ambulatory Visit | Attending: Cardiology | Admitting: Cardiology

## 2020-05-06 ENCOUNTER — Ambulatory Visit: Payer: 59 | Admitting: Physical Therapy

## 2020-05-06 DIAGNOSIS — R209 Unspecified disturbances of skin sensation: Secondary | ICD-10-CM | POA: Diagnosis not present

## 2020-05-06 DIAGNOSIS — M542 Cervicalgia: Secondary | ICD-10-CM | POA: Diagnosis not present

## 2020-05-06 DIAGNOSIS — M6281 Muscle weakness (generalized): Secondary | ICD-10-CM | POA: Diagnosis not present

## 2020-05-06 DIAGNOSIS — M5412 Radiculopathy, cervical region: Secondary | ICD-10-CM | POA: Diagnosis not present

## 2020-05-06 DIAGNOSIS — R9431 Abnormal electrocardiogram [ECG] [EKG]: Secondary | ICD-10-CM | POA: Diagnosis not present

## 2020-05-06 MED ORDER — NITROGLYCERIN 0.4 MG SL SUBL
0.8000 mg | SUBLINGUAL_TABLET | Freq: Once | SUBLINGUAL | Status: AC
  Administered 2020-05-06: 16:00:00 0.8 mg via SUBLINGUAL

## 2020-05-06 MED ORDER — IOHEXOL 350 MG/ML SOLN
80.0000 mL | Freq: Once | INTRAVENOUS | Status: AC | PRN
  Administered 2020-05-06: 16:00:00 80 mL via INTRAVENOUS

## 2020-05-06 MED ORDER — NITROGLYCERIN 0.4 MG SL SUBL
SUBLINGUAL_TABLET | SUBLINGUAL | Status: AC
  Filled 2020-05-06: qty 2

## 2020-05-06 NOTE — Therapy (Signed)
[place], [place], [address] Phone: [phone]   Fax:  [phone]  Physical Therapy Treatment  Patient Details  Name: [name] MRN: [id number] Date of Birth: [birth year] Referring Provider (PT): Dr. Clearance Coots   Encounter Date: 05/06/2020  PT End of Session - 05/06/20 1228    Visit Number  3    Number of Visits  12    Date for PT Re-Evaluation  05/31/20    PT Start Time  1217    PT Stop Time  1310    PT Time Calculation (min)  53 min    Activity Tolerance  Patient tolerated treatment well    Behavior During Therapy  [facility] for tasks assessed/performed       Past Medical History:  Diagnosis Date  . Allergy   . Anemia    hx  . Chronic female pelvic pain    improved after hysterectomy  . Cyst of left kidney    rt and lft  . Dry eye syndrome    and eczema  . GERD (gastroesophageal reflux disease)   . History of kidney stones   . History of nephrolithiasis   . History of syncope    during pregnancy    Past Surgical History:  Procedure Laterality Date  . APPENDECTOMY    . CHOLECYSTECTOMY N/A 05/26/2017   Procedure: LAPAROSCOPIC CHOLECYSTECTOMY;  Surgeon: Coralie Keens, MD;  Location: [place];  Service: General;  Laterality: N/A;  . COLONOSCOPY  2003   Dr. Isa Rankin  . TONSILLECTOMY    . UPPER GASTROINTESTINAL ENDOSCOPY    . VAGINAL HYSTERECTOMY      There were no vitals filed for this visit.  Subjective Assessment - 05/06/20 1221    Subjective  I havent dedicated myself to this.  I need to do less of the wrong things, too much cleaning and partying.  L side scapular pain.    Currently in Pain?  Yes    Pain Score  3     Pain Location  Scapula    Pain Orientation  Left    Pain Descriptors / Indicators  Sore    Pain Type  Acute pain    Pain Onset  More than a month ago    Pain Frequency  Intermittent    Multiple Pain Sites  No            [facility] Adult PT  Treatment/Exercise - 05/06/20 0001      Neck Exercises: Supine   Shoulder Flexion Limitations  narrow grip x 10 red band       Shoulder Exercises: Supine   Protraction  AROM;Both;10 reps    Horizontal ABduction  Strengthening;Both;10 reps    Theraband Level (Shoulder Horizontal ABduction)  Level 2 (Red)    External Rotation  Strengthening;Both;10 reps    Theraband Level (Shoulder External Rotation)  Level 2 (Red)    Other Supine Exercises  horizontal abduction x 10     Other Supine Exercises  on foam roller (above exerciise)       Shoulder Exercises: Stretch   Other Shoulder Stretches  upper trunk rotaition x 5 each side       Traction   Type of Traction  Cervical    Min (lbs)  8    Max (lbs)  12    Hold Time  15    Rest Time  60             PT Education -  05/06/20 1257    Education Details  traction, foam roller, referred pain, latent Tr Ps , HEP    Person(s) Educated  Patient    Methods  Explanation;Demonstration;Handout    Comprehension  Verbalized understanding;Returned demonstration          PT Long Term Goals - 04/30/20 1016      PT LONG TERM GOAL #1   Title  Pt will be I with HEP for posture and strength, cervical ROM    Status  On-going      PT LONG TERM GOAL #2   Title  FOTO score will improve to 27% or better to demo improved functional mobility.    Status  On-going      PT LONG TERM GOAL #3   Title  Pt will be able to take frequent breaks at work to reduce neck tension and modify workspace    Status  Achieved      PT LONG TERM GOAL #4   Title  Pt will be able to carry light objects, grocery bags, purse with no increase in pain in neck, UE    Status  On-going      PT LONG TERM GOAL #5   Title  Pt will be able to increase L UE strength to 4+/5 or more for maximal shoulder function and stability    Status  On-going            Plan - 05/06/20 1258    Clinical Impression Statement  Patient feels as though her shot is wearing off, feeling  more sore in L arm and scapula. Did  not spend much time with band exercises.  Done today on foam roller to help mobilize scapular region. Trial of traction to see if that can centralize her pain as well.    PT Treatment/Interventions  ADLs/Self Care Home Management;Electrical Stimulation;Cryotherapy;Iontophoresis 4mg /ml Dexamethasone;Moist Heat;Traction;Ultrasound;Therapeutic activities;Therapeutic exercise;Patient/family education;Manual techniques;Taping;Dry needling;Neuromuscular re-education;Passive range of motion;Spinal Manipulations    PT Next Visit Plan  check HEP, posture, manual and consider traction , DN    PT Home Exercise Plan  [id number]: corner stretch, rhomboid, chin tuck, lateral flexion (cervical) and rotation.  Upper trunk rotation. Added red band for supine scap stab today.    Consulted and Agree with Plan of Care  Patient       Patient will benefit from skilled therapeutic intervention in order to improve the following deficits and impairments:  Decreased mobility, Hypomobility, Increased muscle spasms, Impaired sensation, Decreased range of motion, Decreased strength, Increased fascial restricitons, Impaired flexibility, Impaired UE functional use, Postural dysfunction, Pain  Visit Diagnosis: Cervicalgia  Radiculopathy, cervical region  Muscle weakness (generalized)  Sensory disturbance     Problem List Patient Active Problem List   Diagnosis Date Noted  . Abnormal EKG 04/12/2020  . Chest pain of uncertain etiology A999333  . Myofascial pain 04/11/2020  . Cervical radiculopathy 04/11/2020  . Trapezius strain 11/30/2014  . Migraine 11/16/2014  . Migraines 11/08/2014  . 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 Headache(784.0) 09/01/2012  . Angiomyolipoma of kidney, left 05/05/2012  . Abdominal pain 05/01/2012  . COCCYGEAL PAIN 09/26/2009  . PELVIC  PAIN 05/31/2009  . TOBACCO ABUSE 03/26/2009  . EDEMA 03/26/2009  . WEIGHT GAIN 03/26/2009  . NEPHROLITHIASIS, HX OF  03/26/2009    Curtis Uriarte 05/06/2020, 1:01 PM  [place], [place], [address] Phone: [phone]   Fax:  [phone]  Name: [name] MRN: [id number] Date of Birth: 10-Dec-1971  Anderson Malta  Armeda Plumb, PT 05/06/20 1:01 PM Phone: [phone] Fax: [phone]

## 2020-05-06 NOTE — Patient Instructions (Signed)
Sidelying Thoracic Rotation with Open Book sets: 1 reps: 10 hold: 30 daily: 1  weekly: 7      Exercise image step 1   Exercise image step 2   Exercise image step 3  Setup  Begin lying on your side with your legs bent at a 75 degree angle and your arms together straight in front of you on the ground. Movement  Slide your top hand back and forth over your bottom hand 5 times, rotating your shoulders. Then, lift your top arm straight up and over to the floor on your other side. Tip  Make sure to keep your knees together and only rotate your back and upper arm. Your hips should stay facing forward.

## 2020-05-07 ENCOUNTER — Telehealth: Payer: Self-pay

## 2020-05-07 NOTE — Telephone Encounter (Signed)
Spoke with patients husband regarding results and recommendation.  He verbalizes understanding and is agreeable to plan of care. Advised patient/husband to call back with any issues or concerns.  

## 2020-05-07 NOTE — Telephone Encounter (Signed)
-----   Message from Berniece Salines, DO sent at 05/06/2020 10:26 PM EDT ----- Good result, your coronary CTA does not show any evidence of coronary artery disease.  Calcium score 0

## 2020-05-08 ENCOUNTER — Ambulatory Visit: Payer: 59 | Admitting: Physical Therapy

## 2020-05-08 ENCOUNTER — Other Ambulatory Visit: Payer: Self-pay

## 2020-05-08 DIAGNOSIS — M6281 Muscle weakness (generalized): Secondary | ICD-10-CM

## 2020-05-08 DIAGNOSIS — M5412 Radiculopathy, cervical region: Secondary | ICD-10-CM | POA: Diagnosis not present

## 2020-05-08 DIAGNOSIS — M542 Cervicalgia: Secondary | ICD-10-CM | POA: Diagnosis not present

## 2020-05-08 DIAGNOSIS — R209 Unspecified disturbances of skin sensation: Secondary | ICD-10-CM | POA: Diagnosis not present

## 2020-05-08 NOTE — Therapy (Signed)
[place], [place], [address] Phone: [phone]   Fax:  [phone]  Physical Therapy Treatment  Patient Details  Name: [name] MRN: [id number] Date of Birth: 1971/04/05 Referring Provider (PT): Dr. Clearance Coots   Encounter Date: 05/08/2020  PT End of Session - 05/08/20 0804    Visit Number  4    Number of Visits  12    Date for PT Re-Evaluation  05/31/20    PT Start Time  0803    PT Stop Time  0853    PT Time Calculation (min)  50 min    Activity Tolerance  Patient tolerated treatment well    Behavior During Therapy  [facility] for tasks assessed/performed       Past Medical History:  Diagnosis Date  . Allergy   . Anemia    hx  . Chronic female pelvic pain    improved after hysterectomy  . Cyst of left kidney    rt and lft  . Dry eye syndrome    and eczema  . GERD (gastroesophageal reflux disease)   . History of kidney stones   . History of nephrolithiasis   . History of syncope    during pregnancy    Past Surgical History:  Procedure Laterality Date  . APPENDECTOMY    . CHOLECYSTECTOMY N/A 05/26/2017   Procedure: LAPAROSCOPIC CHOLECYSTECTOMY;  Surgeon: Coralie Keens, MD;  Location: [place];  Service: General;  Laterality: N/A;  . COLONOSCOPY  2003   Dr. Isa Rankin  . TONSILLECTOMY    . UPPER GASTROINTESTINAL ENDOSCOPY    . VAGINAL HYSTERECTOMY      There were no vitals filed for this visit.  Subjective Assessment - 05/08/20 0804    Subjective  " I do feel like my neck is feeling better, but I haven't had really bad pain since it happened. I have been trying to do as much exercise.    Currently in Pain?  No/denies    Pain Type  Chronic pain    Pain Frequency  Intermittent    Aggravating Factors   laying down         [facility] PT Assessment - 05/08/20 0001      Assessment   Medical Diagnosis  cervical radiculopathy, myofascial pain     Referring Provider (PT)  Dr. Clearance Coots                   [facility] Adult PT Treatment/Exercise - 05/08/20 0001      Shoulder Exercises: ROM/Strengthening   UBE (Upper Arm Bike)  L2 x 34min   2 min      Traction   Type of Traction  Cervical    Min (lbs)  10    Max (lbs)  14    Hold Time  15    Rest Time  60    Time  10      Manual Therapy   Manual Therapy  Other (comment)    Manual therapy comments  skilled palpation and monitoring of pt throughout TPDN    Soft tissue mobilization  IASTM along L upper trap/ levator scapuale and cervical mulftidi    Other Manual Therapy  L upper trap inhibition taping      Neck Exercises: Stretches   Upper Trapezius Stretch  2 reps;30 seconds    Levator Stretch  2 reps;30 seconds       Trigger Point Dry Needling - 05/08/20 0001  Consent Given?  Yes    Education Handout Provided  Yes    Muscles Treated Head and Neck  Upper trapezius;Levator scapulae;Cervical multifidi    Upper Trapezius Response  Twitch reponse elicited;Palpable increased muscle length   L   Levator Scapulae Response  Twitch response elicited;Palpable increased muscle length   L   Cervical multifidi Response  Twitch reponse elicited;Palpable increased muscle length   C3-C7 L          PT Education - 05/08/20 0918    Education Details  muscle anatomy and referral patterns, what TPDN is, benefits and what to expect. discussed benefits of taping and length of wear.    Person(s) Educated  Patient    Methods  Explanation;Verbal cues;Handout    Comprehension  Verbalized understanding;Verbal cues required          PT Long Term Goals - 04/30/20 1016      PT LONG TERM GOAL #1   Title  Pt will be I with HEP for posture and strength, cervical ROM    Status  On-going      PT LONG TERM GOAL #2   Title  FOTO score will improve to 27% or better to demo improved functional mobility.    Status  On-going      PT LONG TERM GOAL #3   Title  Pt will be able to take frequent breaks at work to  reduce neck tension and modify workspace    Status  Achieved      PT LONG TERM GOAL #4   Title  Pt will be able to carry light objects, grocery bags, purse with no increase in pain in neck, UE    Status  On-going      PT LONG TERM GOAL #5   Title  Pt will be able to increase L UE strength to 4+/5 or more for maximal shoulder function and stability    Status  On-going            Plan - 05/08/20 0908    Clinical Impression Statement  pt reports feeling better since the last session but continues to have pain when she is sleeping at night, that calms with changes in position. educated and consent was given for TPDN focus L upper trap/ levator scapuale and L cervical multifidi followed with cervical gapping mobs and L first rib mobs. trialed inhibition taping which she noted relief of tension. continued traction end of session increasing tension which she reported continued relief of pain.    Personal Factors and Comorbidities  Comorbidity 1;Comorbidity 2;Behavior Pattern;Profession;Time since onset of injury/illness/exacerbation    PT Treatment/Interventions  ADLs/Self Care Home Management;Electrical Stimulation;Cryotherapy;Iontophoresis 4mg /ml Dexamethasone;Moist Heat;Traction;Ultrasound;Therapeutic activities;Therapeutic exercise;Patient/family education;Manual techniques;Taping;Dry needling;Neuromuscular re-education;Passive range of motion;Spinal Manipulations    PT Next Visit Plan  check HEP, posture, manual and consider traction, assess responseDN, how was taping    PT Home Exercise Plan  [id number]: corner stretch, rhomboid, chin tuck, lateral flexion (cervical) and rotation.  Upper trunk rotation. Added red band for supine scap stab today.       Patient will benefit from skilled therapeutic intervention in order to improve the following deficits and impairments:  Decreased mobility, Hypomobility, Increased muscle spasms, Impaired sensation, Decreased range of motion, Decreased strength,  Increased fascial restricitons, Impaired flexibility, Impaired UE functional use, Postural dysfunction, Pain  Visit Diagnosis: Cervicalgia  Radiculopathy, cervical region  Muscle weakness (generalized)     Problem List Patient Active Problem List   Diagnosis Date Noted  .  Abnormal EKG 04/12/2020  . Chest pain of uncertain etiology A999333  . Myofascial pain 04/11/2020  . Cervical radiculopathy 04/11/2020  . Trapezius strain 11/30/2014  . Migraine 11/16/2014  . Migraines 11/08/2014  . 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 Headache(784.0) 09/01/2012  . Angiomyolipoma of kidney, left 05/05/2012  . Abdominal pain 05/01/2012  . COCCYGEAL PAIN 09/26/2009  . PELVIC  PAIN 05/31/2009  . TOBACCO ABUSE 03/26/2009  . EDEMA 03/26/2009  . WEIGHT GAIN 03/26/2009  . NEPHROLITHIASIS, HX OF 03/26/2009   Starr Lake PT, DPT, LAT, ATC  05/08/20  9:23 AM      [facility] [address] [place], [place], [address] Phone: [phone]   Fax:  [phone]  Name: CHASIE FINEFROCK MRN: [id number] Date of Birth: 01-21-1971

## 2020-05-13 ENCOUNTER — Ambulatory Visit: Payer: 59 | Admitting: Physical Therapy

## 2020-05-14 ENCOUNTER — Ambulatory Visit: Payer: 59 | Admitting: Physical Therapy

## 2020-05-15 ENCOUNTER — Ambulatory Visit: Payer: 59 | Admitting: Physical Therapy

## 2020-05-15 ENCOUNTER — Other Ambulatory Visit: Payer: Self-pay

## 2020-05-15 ENCOUNTER — Encounter: Payer: Self-pay | Admitting: Physical Therapy

## 2020-05-15 DIAGNOSIS — M6281 Muscle weakness (generalized): Secondary | ICD-10-CM

## 2020-05-15 DIAGNOSIS — R209 Unspecified disturbances of skin sensation: Secondary | ICD-10-CM | POA: Diagnosis not present

## 2020-05-15 DIAGNOSIS — M542 Cervicalgia: Secondary | ICD-10-CM

## 2020-05-15 DIAGNOSIS — M5412 Radiculopathy, cervical region: Secondary | ICD-10-CM

## 2020-05-15 NOTE — Therapy (Signed)
[place], [place], [address] Phone: [phone]   Fax:  [phone]  Physical Therapy Treatment  Patient Details  Name: [name] MRN: [id number] Date of Birth: [birth year] Referring Provider (PT): Dr. Clearance Coots   Encounter Date: 05/15/2020  PT End of Session - 05/15/20 0803    Visit Number  5    Number of Visits  12    Date for PT Re-Evaluation  05/31/20    PT Start Time  0801    PT Stop Time  0845    PT Time Calculation (min)  44 min    Activity Tolerance  Patient tolerated treatment well       Past Medical History:  Diagnosis Date  . Allergy   . Anemia    hx  . Chronic female pelvic pain    improved after hysterectomy  . Cyst of left kidney    rt and lft  . Dry eye syndrome    and eczema  . GERD (gastroesophageal reflux disease)   . History of kidney stones   . History of nephrolithiasis   . History of syncope    during pregnancy    Past Surgical History:  Procedure Laterality Date  . APPENDECTOMY    . CHOLECYSTECTOMY N/A 05/26/2017   Procedure: LAPAROSCOPIC CHOLECYSTECTOMY;  Surgeon: Coralie Keens, MD;  Location: [facility];  Service: General;  Laterality: N/A;  . COLONOSCOPY  2003   Dr. Isa Rankin  . TONSILLECTOMY    . UPPER GASTROINTESTINAL ENDOSCOPY    . VAGINAL HYSTERECTOMY      There were no vitals filed for this visit.  Subjective Assessment - 05/15/20 0803    Subjective  "I am doing about the same, maybe alittle better. I do notice my neck gets stiff after prolonged sitting. I still have issue with reaching back and to the side of my L arm"    Currently in Pain?  Yes    Pain Score  0-No pain   gets up to an 8/10 in specific positions   Pain Descriptors / Indicators  Aching;Sharp    Pain Type  Chronic pain    Pain Onset  More than a month ago    Pain Frequency  Intermittent    Aggravating Factors   reaching back and ove rhead    Pain Relieving Factors  resting,  heat, meds         [facility] PT Assessment - 05/15/20 0001      Assessment   Medical Diagnosis  cervical radiculopathy, myofascial pain     Referring Provider (PT)  Dr. Clearance Coots                    [facility] Adult PT Treatment/Exercise - 05/15/20 0001      Shoulder Exercises: Seated   Other Seated Exercises  lower trap strengtheing 2 x 15 with red theraband  with elbows propped on bolster      Shoulder Exercises: Standing   Protraction  Strengthening;Left;15 reps    Internal Rotation  Strengthening;15 reps;Theraband    Theraband Level (Shoulder Internal Rotation)  Level 2 (Red)    Flexion  Strengthening;Both;15 reps   scaption angle     Manual Therapy   Manual therapy comments  skilled palpation and monitoring of pt throughout TPDN    Soft tissue mobilization  IASTM along L upper trap/ levator scapuale and cervical mulftidi    Other Manual Therapy  bil upper trap inhibition taping  Neck Exercises: Stretches   Upper Trapezius Stretch  2 reps;30 seconds    Levator Stretch  2 reps;30 seconds       Trigger Point Dry Needling - 05/15/20 0001    Consent Given?  Yes    Education Handout Provided  Previously provided    Muscles Treated Upper Quadrant  Supraspinatus    Upper Trapezius Response  Twitch reponse elicited;Palpable increased muscle length   LUE   Supraspinatus Response  Twitch response elicited;Palpable increased muscle length   LUE           PT Education - 05/15/20 0913    Education Details  reviewed HEp and updated today for shoulder stabilization    Person(s) Educated  Patient    Methods  Explanation;Verbal cues;Handout    Comprehension  Verbalized understanding;Verbal cues required          PT Long Term Goals - 04/30/20 1016      PT LONG TERM GOAL #1   Title  Pt will be I with HEP for posture and strength, cervical ROM    Status  On-going      PT LONG TERM GOAL #2   Title  FOTO score will improve to 27% or better to demo  improved functional mobility.    Status  On-going      PT LONG TERM GOAL #3   Title  Pt will be able to take frequent breaks at work to reduce neck tension and modify workspace    Status  Achieved      PT LONG TERM GOAL #4   Title  Pt will be able to carry light objects, grocery bags, purse with no increase in pain in neck, UE    Status  On-going      PT LONG TERM GOAL #5   Title  Pt will be able to increase L UE strength to 4+/5 or more for maximal shoulder function and stability    Status  On-going            Plan - 05/15/20 0856    Clinical Impression Statement  pt reports relief since the last session but continues to report shoulder pain with reaching back. Further assessment revealed postive findings suggesting high liklihood of impingement. Continued TPDN focusing on the suprapsinatus and upper trap on the L followed with IASTM technieus and inhibition taping. focused strengthening to stabilize the shoulder blade which she responded well to and noted decreased pain with overhead reaching.    PT Treatment/Interventions  ADLs/Self Care Home Management;Electrical Stimulation;Cryotherapy;Iontophoresis 4mg /ml Dexamethasone;Moist Heat;Traction;Ultrasound;Therapeutic activities;Therapeutic exercise;Patient/family education;Manual techniques;Taping;Dry needling;Neuromuscular re-education;Passive range of motion;Spinal Manipulations    PT Next Visit Plan  , posture, manual and consider traction, assess responseDN, how was taping, how did she respond to shoulder strengthening    PT Home Exercise Plan  [id number]: corner stretch, rhomboid, chin tuck, lateral flexion (cervical) and rotation.  Upper trunk rotation. Added red band for supine scap stab today. scaption, shoulder IR, shouler protraction    Consulted and Agree with Plan of Care  Patient       Patient will benefit from skilled therapeutic intervention in order to improve the following deficits and impairments:  Decreased mobility,  Hypomobility, Increased muscle spasms, Impaired sensation, Decreased range of motion, Decreased strength, Increased fascial restricitons, Impaired flexibility, Impaired UE functional use, Postural dysfunction, Pain  Visit Diagnosis: Cervicalgia  Radiculopathy, cervical region  Muscle weakness (generalized)     Problem List Patient Active Problem List   Diagnosis Date Noted  .  Abnormal EKG 04/12/2020  . Chest pain of uncertain etiology [id number]  . Myofascial pain 04/11/2020  . Cervical radiculopathy 04/11/2020  . Trapezius strain 11/30/2014  . Migraine 11/16/2014  . Migraines 11/08/2014  . 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 Headache(784.0) 09/01/2012  . Angiomyolipoma of kidney, left 05/05/2012  . Abdominal pain 05/01/2012  . COCCYGEAL PAIN 09/26/2009  . PELVIC  PAIN 05/31/2009  . TOBACCO ABUSE 03/26/2009  . EDEMA 03/26/2009  . WEIGHT GAIN 03/26/2009  . NEPHROLITHIASIS, HX OF 03/26/2009   Starr Lake PT, DPT, LAT, ATC  05/15/20  9:13 AM      [facility] [address] [place], [place], [address] Phone: [phone]   Fax:  [phone]  Name: [name] MRN: [id number] Date of Birth: [birth year]

## 2020-05-23 DIAGNOSIS — H524 Presbyopia: Secondary | ICD-10-CM | POA: Diagnosis not present

## 2020-05-23 DIAGNOSIS — H1045 Other chronic allergic conjunctivitis: Secondary | ICD-10-CM | POA: Diagnosis not present

## 2020-05-23 DIAGNOSIS — H52203 Unspecified astigmatism, bilateral: Secondary | ICD-10-CM | POA: Diagnosis not present

## 2020-05-23 DIAGNOSIS — H5212 Myopia, left eye: Secondary | ICD-10-CM | POA: Diagnosis not present

## 2020-05-23 DIAGNOSIS — H5211 Myopia, right eye: Secondary | ICD-10-CM | POA: Diagnosis not present

## 2020-05-29 ENCOUNTER — Other Ambulatory Visit: Payer: Self-pay

## 2020-05-29 ENCOUNTER — Ambulatory Visit: Payer: 59 | Attending: Family Medicine | Admitting: Physical Therapy

## 2020-05-29 DIAGNOSIS — M542 Cervicalgia: Secondary | ICD-10-CM | POA: Insufficient documentation

## 2020-05-29 DIAGNOSIS — M6281 Muscle weakness (generalized): Secondary | ICD-10-CM | POA: Diagnosis not present

## 2020-05-29 DIAGNOSIS — M5412 Radiculopathy, cervical region: Secondary | ICD-10-CM | POA: Diagnosis not present

## 2020-05-29 NOTE — Therapy (Signed)
[place], [place], [address] Phone: [phone]   Fax:  [phone]  Physical Therapy Treatment  Patient Details  Name: [name] MRN: [id number] Date of Birth: 21-Aug-1971 Referring Provider (PT): Dr. Clearance Coots   Encounter Date: 05/29/2020  PT End of Session - 05/29/20 1550    Visit Number  6    Number of Visits  12    Date for PT Re-Evaluation  07/10/20    PT Start Time  1548    PT Stop Time  1628    PT Time Calculation (min)  40 min    Activity Tolerance  Patient tolerated treatment well    Behavior During Therapy  [facility] for tasks assessed/performed       Past Medical History:  Diagnosis Date  . Allergy   . Anemia    hx  . Chronic female pelvic pain    improved after hysterectomy  . Cyst of left kidney    rt and lft  . Dry eye syndrome    and eczema  . GERD (gastroesophageal reflux disease)   . History of kidney stones   . History of nephrolithiasis   . History of syncope    during pregnancy    Past Surgical History:  Procedure Laterality Date  . APPENDECTOMY    . CHOLECYSTECTOMY N/A 05/26/2017   Procedure: LAPAROSCOPIC CHOLECYSTECTOMY;  Surgeon: Coralie Keens, MD;  Location: [place] [facility];  Service: General;  Laterality: N/A;  . COLONOSCOPY  2003   Dr. Isa Rankin  . TONSILLECTOMY    . UPPER GASTROINTESTINAL ENDOSCOPY    . VAGINAL HYSTERECTOMY      There were no vitals filed for this visit.  Subjective Assessment - 05/29/20 1550    Subjective  " I think thinkgs are alot better. I think therapy has been helping but I haven't worked from Thursday  to Tuesday so I think work has a big component."    Currently in Pain?  Yes    Pain Orientation  Left         [facility] PT Assessment - 05/29/20 0001      Assessment   Medical Diagnosis  cervical radiculopathy, myofascial pain     Referring Provider (PT)  Dr. Clearance Coots      Strength   Left Shoulder Flexion  3+/5   in  available ROM   Left Shoulder ABduction  3+/5   in available ROM   Left Elbow Flexion  4+/5    Left Elbow Extension  4+/5      Palpation   Palpation comment  TTP along the L upper trap/ levator scapuale,, and along the triceps with mulitple                     [facility] Adult PT Treatment/Exercise - 05/29/20 0001      Self-Care   Self-Care  Other Self-Care Comments    Other Self-Care Comments   how to perfrom self trigger point release using tools to assist with technique      Manual Therapy   Manual Therapy  Joint mobilization    Manual therapy comments  MTPR along L levator scapulae and triceps    Passive ROM  cervical rotation working into L rotation oscillating at end range   pt noted gradual reduction of pain/ stiffness     Neck Exercises: Stretches   Upper Trapezius Stretch  1 rep;30 seconds  PT Education - 05/29/20 1722    Education Details  reviewed HEP and dicussed pt progress compared to inital evaluation, and FOTO reassesment findings.    Person(s) Educated  Patient    Methods  Explanation;Verbal cues;Handout    Comprehension  Verbalized understanding;Verbal cues required          PT Long Term Goals - 05/29/20 1600      PT LONG TERM GOAL #1   Title  Pt will be I with HEP for posture and strength, cervical ROM    Period  Weeks    Status  Achieved      PT LONG TERM GOAL #2   Title  FOTO score will improve to 27% or better to demo improved functional mobility.    Period  Weeks    Status  Partially Met      PT LONG TERM GOAL #3   Title  Pt will be able to take frequent breaks at work to reduce neck tension and modify workspace    Period  Weeks    Status  Achieved      PT LONG TERM GOAL #4   Title  Pt will be able to carry light objects, grocery bags, purse with no increase in pain in neck, UE    Period  Weeks    Status  On-going      PT LONG TERM GOAL #5   Title  Pt will be able to increase L UE strength to 4+/5 or more for  maximal shoulder function and stability    Status  On-going            Plan - 05/29/20 1723    Clinical Impression Statement  Mrs Grieb continues to make progress with physical therapy increasing shoulder ROM and strength. she does note continued soreness with reaching back but states it is much better than it was previously. she met LTG #1 and #3 today and is making good progress toward her remaining goals. focused session manual trigger point release and how it can be performed at and cerivcal ROM and mobs which she responded well to. She would benefit from continued physical therapy to decrease neck and L shoulder pain, increase strenght and maximize function by addressing the deficits listed.    PT Treatment/Interventions  ADLs/Self Care Home Management;Electrical Stimulation;Cryotherapy;Iontophoresis 58m/ml Dexamethasone;Moist Heat;Traction;Ultrasound;Therapeutic activities;Therapeutic exercise;Patient/family education;Manual techniques;Taping;Dry needling;Neuromuscular re-education;Passive range of motion;Spinal Manipulations    PT Next Visit Plan  posture, manual and consider traction, assess response DN for upper trap/ levator scapulae and triceps. how was taping, how did she respond to shoulder strengthening, continue working cervical mobs for gapping of the L side, and rotation. trigger point r    PT Home Exercise Plan  [id number] corner stretch, rhomboid, chin tuck, lateral flexion (cervical) and rotation.  Upper trunk rotation. Added red band for supine scap stab today. scaption, shoulder IR, shouler protraction    Consulted and Agree with Plan of Care  Patient       Patient will benefit from skilled therapeutic intervention in order to improve the following deficits and impairments:  Decreased mobility, Hypomobility, Increased muscle spasms, Impaired sensation, Decreased range of motion, Decreased strength, Increased fascial restricitons, Impaired flexibility, Impaired UE functional  use, Postural dysfunction, Pain  Visit Diagnosis: Cervicalgia  Radiculopathy, cervical region  Muscle weakness (generalized)     Problem List Patient Active Problem List   Diagnosis Date Noted  . Abnormal EKG 04/12/2020  . Chest pain of uncertain etiology [id number] . Myofascial  pain 04/11/2020  . Cervical radiculopathy 04/11/2020  . Trapezius strain 11/30/2014  . Migraine 11/16/2014  . Migraines 11/08/2014  . 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 Headache(784.0) 09/01/2012  . Angiomyolipoma of kidney, left 05/05/2012  . Abdominal pain 05/01/2012  . COCCYGEAL PAIN 09/26/2009  . PELVIC  PAIN 05/31/2009  . TOBACCO ABUSE 03/26/2009  . EDEMA 03/26/2009  . WEIGHT GAIN 03/26/2009  . NEPHROLITHIASIS, HX OF 03/26/2009    Starr Lake PT, DPT, LAT, ATC  05/29/20  5:32 PM      [place] [facility] [address] [place], [place], [address] Phone: [phone]   Fax:  [phone]  Name: [name] MRN: [id number] Date of Birth: 1971/01/16

## 2020-06-03 ENCOUNTER — Other Ambulatory Visit: Payer: Self-pay

## 2020-06-03 ENCOUNTER — Ambulatory Visit: Payer: 59 | Admitting: Physical Therapy

## 2020-06-03 ENCOUNTER — Encounter: Payer: Self-pay | Admitting: Physical Therapy

## 2020-06-03 DIAGNOSIS — M5412 Radiculopathy, cervical region: Secondary | ICD-10-CM | POA: Diagnosis not present

## 2020-06-03 DIAGNOSIS — M542 Cervicalgia: Secondary | ICD-10-CM

## 2020-06-03 DIAGNOSIS — M6281 Muscle weakness (generalized): Secondary | ICD-10-CM | POA: Diagnosis not present

## 2020-06-03 NOTE — Therapy (Signed)
[place], [place], [address] Phone: [phone]   Fax:  [phone]  Physical Therapy Treatment  Patient Details  Name: [name] MRN: [id number] Date of Birth: August 17, 1971 Referring Provider (PT): Dr. Clearance Coots   Encounter Date: 06/03/2020  PT End of Session - 06/03/20 1337    Visit Number  7    Number of Visits  12    Date for PT Re-Evaluation  07/10/20    PT Start Time  1336   pt arrived 82mn late   PT Stop Time  1414    PT Time Calculation (min)  38 min    Activity Tolerance  Patient tolerated treatment well    Behavior During Therapy  [facility]       Past Medical History:  Diagnosis Date  . Allergy   . Anemia    hx  . Chronic female pelvic pain    improved after hysterectomy  . Cyst of left kidney    rt and lft  . Dry eye syndrome    and eczema  . GERD (gastroesophageal reflux disease)   . History of kidney stones   . History of nephrolithiasis   . History of syncope    during pregnancy    Past Surgical History:  Procedure Laterality Date  . APPENDECTOMY    . CHOLECYSTECTOMY N/A 05/26/2017   Procedure: LAPAROSCOPIC CHOLECYSTECTOMY;  Surgeon: BCoralie Keens MD;  [place]  Service: General;  Laterality: N/A;  . COLONOSCOPY  2003   Dr. MIsa Rankin . TONSILLECTOMY    . UPPER GASTROINTESTINAL ENDOSCOPY    . VAGINAL HYSTERECTOMY      There were no vitals filed for this visit.  Subjective Assessment - 06/03/20 1339    Subjective  "I really feel like I am improving, still doing the exercises and it helps"    Diagnostic tests  XR done and showed mild spondylosis C6-C7    Currently in Pain?  Yes    Pain Score  0-No pain    Pain Orientation  Left    Pain Type  Chronic pain    Pain Onset  More than a month ago    Pain Frequency  Intermittent    Aggravating Factors   reaching back and overhead         [facility]       Assessment   Medical Diagnosis  cervical radiculopathy, myofascial pain     Referring Provider (PT)  Dr. JClearance Coots                   [facility] 06/[facility]/20 0001      Shoulder Exercises: Supine   Other Supine Exercises  thoracic extension over bolster 1 x 10 with hands behind head, 1 x 8 reaching backward       Shoulder Exercises: Prone   Other Prone Exercises  I's Y's and T's 1 x 12      Manual Therapy   Manual Therapy  Joint mobilization    Manual therapy comments  skilled palpation and monitoring of pt throughout TPDN    Joint Mobilization  grade III T1-T9 CPA and R UPA     Soft tissue mobilization  IASTM along L upper trap/ levator scapuale and cervical mulftidi       Trigger Point Dry Needling - 06/03/20 0001    Consent Given?  Yes    Education Handout  Provided  Previously provided    Muscles Treated Upper Quadrant  Teres major;Teres minor;Triceps    Upper Trapezius Response  Twitch reponse elicited;Palpable increased muscle length   L only   Teres major Response  Twitch response elicited;Palpable increased muscle length   L only   Teres minor Response  Twitch response elicited;Palpable increased muscle length   L only   Triceps Response  Twitch response elicited;Palpable increased muscle length   L only               PT Long Term Goals - 05/29/20 1600      PT LONG TERM GOAL #1   Title  Pt will be I with HEP for posture and strength, cervical ROM    Period  Weeks    Status  Achieved      PT LONG TERM GOAL #2   Title  FOTO score will improve to 27% or better to demo improved functional mobility.    Period  Weeks    Status  Partially Met      PT LONG TERM GOAL #3   Title  Pt will be able to take frequent breaks at work to reduce neck tension and modify workspace    Period  Weeks    Status  Achieved      PT LONG TERM GOAL #4   Title  Pt will be able to carry light objects, grocery bags, purse with no  increase in pain in neck, UE    Period  Weeks    Status  On-going      PT LONG TERM GOAL #5   Title  Pt will be able to increase L UE strength to 4+/5 or more for maximal shoulder function and stability    Status  On-going            Plan - 06/03/20 1439    Clinical Impression Statement  pt conitnues to report improvement of the L shoulder with reaching behead and over her head. continued TPDN focusing on the L upper trap/ levator ,teres minor/ major and proxmial tricep. continued posterior chain strengthening whcih she fatigued quiclky but was able to complete, and added bil UE movement combined with thoracic extension over bolster.    PT Treatment/Interventions  ADLs/Self Care Home Management;Electrical Stimulation;Cryotherapy;Iontophoresis 82m/ml Dexamethasone;Moist Heat;Traction;Ultrasound;Therapeutic activities;Therapeutic exercise;Patient/family education;Manual techniques;Taping;Dry needling;Neuromuscular re-education;Passive range of motion;Spinal Manipulations    PT Next Visit Plan  posture, manual and consider traction, assess response DN for upper trap/ levator scapulae and triceps. how was taping, how did she respond to shoulder strengthening, continue working cervical mobs for gapping of the L side, and rotation. trigger point r    PT Home Exercise Plan  [id number] corner stretch, rhomboid, chin tuck, lateral flexion (cervical) and rotation.  Upper trunk rotation. Added red band for supine scap stab today. scaption, shoulder IR, shouler protraction    Consulted and Agree with Plan of Care  Patient       Patient will benefit from skilled therapeutic intervention in order to improve the following deficits and impairments:  Decreased mobility, Hypomobility, Increased muscle spasms, Impaired sensation, Decreased range of motion, Decreased strength, Increased fascial restricitons, Impaired flexibility, Impaired UE functional use, Postural dysfunction, Pain  Visit  Diagnosis: Cervicalgia  Radiculopathy, cervical region  Muscle weakness (generalized)     Problem List Patient Active Problem List   Diagnosis Date Noted  . Abnormal EKG 04/12/2020  . Chest pain of uncertain etiology 076/16/0737 . Myofascial pain 04/11/2020  .  Cervical radiculopathy 04/11/2020  . Trapezius strain 11/30/2014  . Migraine 11/16/2014  . Migraines 11/08/2014  . 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 Headache(784.0) 09/01/2012  . Angiomyolipoma of kidney, left 05/05/2012  . Abdominal pain 05/01/2012  . COCCYGEAL PAIN 09/26/2009  . PELVIC  PAIN 05/31/2009  . TOBACCO ABUSE 03/26/2009  . EDEMA 03/26/2009  . WEIGHT GAIN 03/26/2009  . NEPHROLITHIASIS, HX OF 03/26/2009    Starr Lake PT, DPT, LAT, ATC  06/03/20  2:47 PM      [place] [facility] [address] [place], [place], [address] Phone: [phone]   Fax:  [phone]  Name: [name] MRN: [id number] Date of Birth: 1971/05/15

## 2020-06-04 ENCOUNTER — Ambulatory Visit: Payer: 59 | Admitting: Cardiology

## 2020-06-10 ENCOUNTER — Ambulatory Visit: Payer: 59 | Admitting: Physical Therapy

## 2020-06-16 NOTE — Progress Notes (Signed)
Cardiology Office Note:    Date:  06/17/2020   ID:  [place], Solan [birth year], MRN [id number]  PCP:  Mackie Pai, PA-C  Cardiologist:  Shirlee More, MD    Referring MD: Mackie Pai, PA-C    ASSESSMENT:    1. Abnormal EKG   2. Chest pain of uncertain etiology    PLAN:    In order of problems listed above:  1. She has a variant of normal poor R wave progression I told her in the future if she has an EKG done to tell people to look at her previous and her echocardiogram report. 2. Noncardiac improved resolved with physical therapy for cervical spine disc disease   Next appointment: As needed   Medication Adjustments/Labs and Tests Ordered: Current medicines are reviewed at length with the patient today.  Concerns regarding medicines are outlined above.  No orders of the defined types were placed in this encounter.  No orders of the defined types were placed in this encounter.   No chief complaint on file.   History of Present Illness:    [name] is a 49 y.o. female with a hx of chest pain and abnormal EKG for R wave progression last seen 04/12/2020. Compliance with diet, lifestyle and medications: Yes  There is a good visit with an opportunity reviewed results of echocardiogram no evidence of preceding anterior MI and her cardiac CTA with normal cardiovascular and cardiovascular structures and a calcium score of 0.  She has a very low risk of cardiovascular disease and her symptoms have improved with physical therapy and clearly were radicular from cervical spine.  She is not having chest pain. Printed gave her a copy of reports Echo 04/15/2020: 1. GLS -21.6. Left ventricular ejection fraction, by estimation, is 60 to  65%. The left ventricle has normal function. The left ventricle has no  regional wall motion abnormalities. Left ventricular diastolic parameters  were normal.  2. Right ventricular systolic function is normal. The right ventricular  size  is normal.  3. The mitral valve is normal in structure. No evidence of mitral valve  regurgitation. No evidence of mitral stenosis.  4. The aortic valve is normal in structure. Aortic valve regurgitation is  not visualized. No aortic stenosis is present.  5. The inferior vena cava is normal in size with greater than 50%  respiratory variability, suggesting right atrial pressure of 3 mmHg.  Cardiac CTA 05/06/2020: IMPRESSION: 1. Calcium score 0 2.  Normal right dominant coronary arteries 3.  Normal aortic root diameter 2.6 cm  Past Medical History:  Diagnosis Date  . Allergy   . Anemia    hx  . Chronic female pelvic pain    improved after hysterectomy  . Cyst of left kidney    rt and lft  . Dry eye syndrome    and eczema  . GERD (gastroesophageal reflux disease)   . History of kidney stones   . History of nephrolithiasis   . History of syncope    during pregnancy    Past Surgical History:  Procedure Laterality Date  . APPENDECTOMY    . CHOLECYSTECTOMY N/A 05/26/2017   Procedure: LAPAROSCOPIC CHOLECYSTECTOMY;  Surgeon: Coralie Keens, MD;  Location: [facility];  Service: General;  Laterality: N/A;  . COLONOSCOPY  2003   Dr. Isa Rankin  . TONSILLECTOMY    . UPPER GASTROINTESTINAL ENDOSCOPY    . VAGINAL HYSTERECTOMY      Current Medications: Current Meds  Medication Sig  .  cetirizine (ZYRTEC) 10 MG tablet Take 10 mg by mouth at bedtime.   . nicotine (NICODERM CQ - DOSED IN MG/24 HOURS) 21 mg/24hr patch Place 1 patch (21 mg total) onto the skin daily.     Allergies:   Morphine and related   Social History   Socioeconomic History  . Marital status: Married    Spouse name: Not on file  . Number of children: Not on file  . Years of education: Not on file  . Highest education level: Not on file  Occupational History  . Not on file  Tobacco Use  . Smoking status: Current Every Day Smoker    Packs/day: 0.50    Years: 10.00    Pack years: 5.00    Types:  Cigarettes  . Smokeless tobacco: Never Used  . Tobacco comment: 5 cigarettes daily  Substance and Sexual Activity  . Alcohol use: Yes    Alcohol/week: 0.0 standard drinks    Comment: rarely  . Drug use: No  . Sexual activity: Not on file  Other Topics Concern  . Not on file  Social History Narrative   Education officer, environmental for [employer]   Married 6 years   Children-teenagers   Social Determinants of Health   Financial Resource Strain:   . Difficulty of Paying Living Expenses:   Food Insecurity:   . Worried About Charity fundraiser in the Last Year:   . Arboriculturist in the Last Year:   Transportation Needs:   . Film/video editor (Medical):   Marland Kitchen Lack of Transportation (Non-Medical):   Physical Activity:   . Days of Exercise per Week:   . Minutes of Exercise per Session:   Stress:   . Feeling of Stress :   Social Connections:   . Frequency of Communication with Friends and Family:   . Frequency of Social Gatherings with Friends and Family:   . Attends Religious Services:   . Active Member of Clubs or Organizations:   . Attends Archivist Meetings:   Marland Kitchen Marital Status:      Family History: The patient's family history includes Colon polyps in her father; Coronary artery disease in her paternal grandfather; Diabetes type II in her paternal grandfather; Hyperlipidemia in her paternal grandfather; Hypertension in an other family member; Stomach cancer in an other family member; Uterine cancer in her mother. There is no history of Breast cancer, Colon cancer, Rectal cancer, or Pancreatic cancer. ROS:   Please see the history of present illness.    All other systems reviewed and are negative.  EKGs/Labs/Other Studies Reviewed:    The following studies were reviewed today:    Recent Labs: 04/10/2020: Hemoglobin 14.3; Platelets 287 04/12/2020: BUN 12; Creatinine, Ser 0.76; Potassium 4.0; Sodium 136  Recent Lipid Panel    Component Value Date/Time   CHOL 136  10/09/2016 0850   TRIG 63.0 10/09/2016 0850   HDL 45.90 10/09/2016 0850   CHOLHDL 3 10/09/2016 0850   VLDL 12.6 10/09/2016 0850   LDLCALC 78 10/09/2016 0850    Physical Exam:    VS:  BP 122/80   Pulse 66   Ht 5\' 1"  (1.549 m)   Wt 150 lb (68 kg)   SpO2 98%   BMI 28.34 kg/m     Wt Readings from Last 3 Encounters:  06/17/20 150 lb (68 kg)  04/12/20 150 lb (68 kg)  04/10/20 150 lb 9.6 oz (68.3 kg)     GEN:  Well nourished,  well developed in no acute distress HEENT: Normal NECK: No JVD; No carotid bruits LYMPHATICS: No lymphadenopathy CARDIAC: RRR, no murmurs, rubs, gallops RESPIRATORY:  Clear to auscultation without rales, wheezing or rhonchi  ABDOMEN: Soft, non-tender, non-distended MUSCULOSKELETAL:  No edema; No deformity  SKIN: Warm and dry NEUROLOGIC:  Alert and oriented x 3 PSYCHIATRIC:  Normal affect    Signed, Shirlee More, MD  06/17/2020 9:34 AM    [place]

## 2020-06-17 ENCOUNTER — Encounter: Payer: Self-pay | Admitting: Cardiology

## 2020-06-17 ENCOUNTER — Ambulatory Visit (INDEPENDENT_AMBULATORY_CARE_PROVIDER_SITE_OTHER): Payer: 59 | Admitting: Cardiology

## 2020-06-17 ENCOUNTER — Other Ambulatory Visit: Payer: Self-pay

## 2020-06-17 ENCOUNTER — Encounter: Payer: Self-pay | Admitting: Physical Therapy

## 2020-06-17 ENCOUNTER — Ambulatory Visit: Payer: 59 | Admitting: Physical Therapy

## 2020-06-17 VITALS — BP 122/80 | HR 66 | Ht 61.0 in | Wt 150.0 lb

## 2020-06-17 DIAGNOSIS — R9431 Abnormal electrocardiogram [ECG] [EKG]: Secondary | ICD-10-CM

## 2020-06-17 DIAGNOSIS — M6281 Muscle weakness (generalized): Secondary | ICD-10-CM

## 2020-06-17 DIAGNOSIS — M5412 Radiculopathy, cervical region: Secondary | ICD-10-CM

## 2020-06-17 DIAGNOSIS — R079 Chest pain, unspecified: Secondary | ICD-10-CM

## 2020-06-17 DIAGNOSIS — M542 Cervicalgia: Secondary | ICD-10-CM

## 2020-06-17 NOTE — Patient Instructions (Signed)

## 2020-06-17 NOTE — Therapy (Addendum)
[place], [place], [address] Phone: [phone]   Fax:  [phone]  Physical Therapy Treatment / discharge  Patient Details  Name: [name] MRN: [id number] Date of Birth: 1971-03-01 Referring Provider (PT): Dr. Clearance Coots   Encounter Date: 06/17/2020   PT End of Session - 06/17/20 1326    Visit Number 8    Number of Visits 12    Date for PT Re-Evaluation 07/10/20    PT Start Time 3846    PT Stop Time 1410    PT Time Calculation (min) 42 min    Activity Tolerance Patient tolerated treatment well    Behavior During Therapy [facility] for tasks assessed/performed           Past Medical History:  Diagnosis Date  . Allergy   . Anemia    hx  . Chronic female pelvic pain    improved after hysterectomy  . Cyst of left kidney    rt and lft  . Dry eye syndrome    and eczema  . GERD (gastroesophageal reflux disease)   . History of kidney stones   . History of nephrolithiasis   . History of syncope    during pregnancy    Past Surgical History:  Procedure Laterality Date  . APPENDECTOMY    . CHOLECYSTECTOMY N/A 05/26/2017   Procedure: LAPAROSCOPIC CHOLECYSTECTOMY;  Surgeon: Coralie Keens, MD;  Location: [facility];  Service: General;  Laterality: N/A;  . COLONOSCOPY  2003   Dr. Isa Rankin  . TONSILLECTOMY    . UPPER GASTROINTESTINAL ENDOSCOPY    . VAGINAL HYSTERECTOMY      There were no vitals filed for this visit.   Subjective Assessment - 06/17/20 1326    Subjective "I am doing actually really good. I got the theracane and it is really helping alot"    Diagnostic tests XR done and showed mild spondylosis C6-C7    Patient Stated Goals Pain relief    Currently in Pain? Yes    Pain Score 1     Pain Location Scapula    Pain Orientation Left              [facility] PT Assessment - 06/17/20 0001      Assessment   Medical Diagnosis cervical radiculopathy, myofascial pain     Referring  Provider (PT) Dr. Clearance Coots                         [facility] Adult PT Treatment/Exercise - 06/17/20 0001      Shoulder Exercises: Standing   Protraction Strengthening;Right;15 reps   sustained with rhythmic stab   External Rotation Strengthening;15 reps;Theraband    Theraband Level (Shoulder External Rotation) Level 3 (Green)   x 2 sets   Internal Rotation Strengthening;15 reps;Theraband    x 2 sets   Theraband Level (Shoulder Internal Rotation) Level 3 (Green)    Flexion Strengthening;Both;15 reps;Other (comment)   scaption angle 2 x 15   Shoulder Flexion Weight (lbs) 2      Shoulder Exercises: ROM/Strengthening   UBE (Upper Arm Bike) L2 x 11mn      Manual Therapy   Manual therapy comments skilled palpation and monitoring of pt throughout TPDN    Joint Mobilization GHJ PA/AP grade III      Neck Exercises: Stretches   Upper Trapezius Stretch 1 rep;30 seconds    Levator Stretch 1 rep;30 seconds  Trigger Point Dry Needling - 06/17/20 0001    Consent Given? Yes    Muscles Treated Upper Quadrant Subscapularis    Subscapularis Response Twitch response elicited;Palpable increased muscle length   R only                    PT Long Term Goals - 05/29/20 1600      PT LONG TERM GOAL #1   Title Pt will be I with HEP for posture and strength, cervical ROM    Period Weeks    Status Achieved      PT LONG TERM GOAL #2   Title FOTO score will improve to 27% or better to demo improved functional mobility.    Period Weeks    Status Partially Met      PT LONG TERM GOAL #3   Title Pt will be able to take frequent breaks at work to reduce neck tension and modify workspace    Period Weeks    Status Achieved      PT LONG TERM GOAL #4   Title Pt will be able to carry light objects, grocery bags, purse with no increase in pain in neck, UE    Period Weeks    Status On-going      PT LONG TERM GOAL #5   Title Pt will be able to increase L UE  strength to 4+/5 or more for maximal shoulder function and stability    Status On-going                 Plan - 06/17/20 1410    Clinical Impression Statement pt has made great progress with treatment noting mild to no pain with 90/90 ER. reproduction of symptoms was noted with resisted IR of the L shoulder. TPDN was performed on the sub-scapularis followed with gross shoulder strengthening which she did well but fatigued quickly. end of session she reported mild soreness with reaching likely from the DN.    PT Treatment/Interventions ADLs/Self Care Home Management;Electrical Stimulation;Cryotherapy;Iontophoresis 55m/ml Dexamethasone;Moist Heat;Traction;Ultrasound;Therapeutic activities;Therapeutic exercise;Patient/family education;Manual techniques;Taping;Dry needling;Neuromuscular re-education;Passive range of motion;Spinal Manipulations    PT Next Visit Plan more visits vs D/C?, FOTO, ROM/ goals. gross shoulder strengthening,    PT Home Exercise Plan [id number] corner stretch, rhomboid, chin tuck, lateral flexion (cervical) and rotation.  Upper trunk rotation. Added red band for supine scap stab today. scaption, shoulder IR, shouler protraction    Consulted and Agree with Plan of Care Patient           Patient will benefit from skilled therapeutic intervention in order to improve the following deficits and impairments:  Decreased mobility, Hypomobility, Increased muscle spasms, Impaired sensation, Decreased range of motion, Decreased strength, Increased fascial restricitons, Impaired flexibility, Impaired UE functional use, Postural dysfunction, Pain  Visit Diagnosis: Cervicalgia  Radiculopathy, cervical region  Muscle weakness (generalized)     Problem List Patient Active Problem List   Diagnosis Date Noted  . Abnormal EKG 04/12/2020  . Chest pain of uncertain etiology 034/74/2595 . Myofascial pain 04/11/2020  . Cervical radiculopathy 04/11/2020  . Trapezius strain  11/30/2014  . Migraine 11/16/2014  . Migraines 11/08/2014  . 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 Headache(784.0) 09/01/2012  . Angiomyolipoma of kidney, left 05/05/2012  . Abdominal pain 05/01/2012  . COCCYGEAL PAIN 09/26/2009  . PELVIC  PAIN 05/31/2009  . TOBACCO ABUSE 03/26/2009  . EDEMA 03/26/2009  . WEIGHT GAIN 03/26/2009  . NEPHROLITHIASIS, HX OF 03/26/2009   Hamda Klutts PT, DPT, LAT, ATC  06/17/20  2:14 PM      [place], [place], [address] Phone: [phone]   Fax:  [phone]  Name: [name] MRN: [id number] Date of Birth: Sep 19, 1971      PHYSICAL THERAPY DISCHARGE SUMMARY  Visits from Start of Care: 8  Current functional level related to goals / functional outcomes: See goals   Remaining deficits: As of last attended visit was making progress. Cancelled her last appointment stating she didn't wan to be rescheduled.    Education / Equipment: HEP, theraband, posture  Plan: Patient agrees to discharge.  Patient goals were partially met. Patient is being discharged due to the patient's request.  ?????         Arya Luttrull PT, DPT, LAT, ATC  07/18/20  3:22 PM

## 2020-06-24 ENCOUNTER — Encounter: Payer: 59 | Admitting: Physical Therapy

## 2020-07-03 DIAGNOSIS — Z1159 Encounter for screening for other viral diseases: Secondary | ICD-10-CM | POA: Diagnosis not present

## 2020-07-03 DIAGNOSIS — Z03818 Encounter for observation for suspected exposure to other biological agents ruled out: Secondary | ICD-10-CM | POA: Diagnosis not present

## 2020-07-04 ENCOUNTER — Ambulatory Visit: Payer: 59 | Admitting: Physical Therapy

## 2020-07-05 ENCOUNTER — Telehealth: Payer: 59 | Admitting: Emergency Medicine

## 2020-07-05 DIAGNOSIS — J329 Chronic sinusitis, unspecified: Secondary | ICD-10-CM | POA: Diagnosis not present

## 2020-07-05 MED ORDER — AMOXICILLIN-POT CLAVULANATE 875-125 MG PO TABS: 1.0000 | ORAL_TABLET | Freq: Two times a day (BID) | ORAL | 0 refills | Status: DC

## 2020-07-05 MED FILL — AMOX-CLAV 875-125 MG TABLET: 875-125 | 7 days supply | Qty: 14 | Fill #0

## 2020-07-05 NOTE — Progress Notes (Signed)

## 2020-07-17 DIAGNOSIS — Z760 Encounter for issue of repeat prescription: Secondary | ICD-10-CM | POA: Diagnosis not present

## 2020-07-23 MED FILL — valACYclovir HCL 1 GM TABS: 1 | 7 days supply | Qty: 14 | Fill #2

## 2020-08-23 ENCOUNTER — Telehealth: Payer: 59 | Admitting: Nurse Practitioner

## 2020-08-23 DIAGNOSIS — J309 Allergic rhinitis, unspecified: Secondary | ICD-10-CM

## 2020-08-23 MED ORDER — MONTELUKAST SODIUM 10 MG PO TABS: 10.0000 mg | ORAL_TABLET | Freq: Every day | ORAL | 0 refills | Status: DC

## 2020-08-23 MED ORDER — FLUTICASONE PROPIONATE 50 MCG/ACT NA SUSP: 2.0000 | Freq: Every day | NASAL | 0 refills | Status: DC

## 2020-08-23 MED FILL — FLUTICASONE PROP 50 MCG SPR: 50 | 30 days supply | Qty: 16 | Fill #0

## 2020-08-23 MED FILL — MONTELUKAST SOD 10 MG TAB: 10 | 30 days supply | Qty: 30 | Fill #0

## 2020-08-23 NOTE — Progress Notes (Signed)
E visit for Allergic Rhinitis We are sorry that you are not feeling well.  Here is how we plan to help!  Based on what you have shared with me it looks like you have Allergic Rhinitis.  Rhinitis is when a reaction occurs that causes nasal congestion, runny nose, sneezing, and itching.  Most types of rhinitis are caused by an inflammation and are associated with symptoms in the eyes ears or throat. There are several types of rhinitis.  The most common are acute rhinitis, which is usually caused by a viral illness, allergic or seasonal rhinitis, and nonallergic or year-round rhinitis.  Nasal allergies occur certain times of the year.  Allergic rhinitis is caused when allergens in the air trigger the release of histamine in the body.  Histamine causes itching, swelling, and fluid to build up in the fragile linings of the nasal passages, sinuses and eyelids.  An itchy nose and clear discharge are common.  I recommend the following treatments: Montelukast 10mg  at bedtime.   I also would recommend a nasal spray: Flonase 2 sprays into each nostril once daily and Saline 1 spray into each nostril as needed  You may also benefit from eye drops such as: Visine 1-2 drops each eye twice daily as needed  HOME CARE:   You can use an over-the-counter saline nasal spray as needed  Avoid areas where there is heavy dust, mites, or molds  Stay indoors on windy days during the pollen season  Keep windows closed in home, at least in bedroom; use air conditioner.  Use high-efficiency house air filter  Keep windows closed in car, turn AC on re-circulate  Avoid playing out with dog during pollen season  GET HELP RIGHT AWAY IF:   If your symptoms do not improve within 10 days  You become short of breath  You develop yellow or green discharge from your nose for over 3 days  You have coughing fits  MAKE SURE YOU:   Understand these instructions  Will watch your condition  Will get help right  away if you are not doing well or get worse  Thank you for choosing an e-visit. Your e-visit answers were reviewed by a board certified advanced clinical practitioner to complete your personal care plan. Depending upon the condition, your plan could have included both over the counter or prescription medications. Please review your pharmacy choice. Be sure that the pharmacy you have chosen is open so that you can pick up your prescription now.  If there is a problem you may message your provider in [place] to have the prescription routed to another pharmacy. Your safety is important to [place]. If you have drug allergies check your prescription carefully.  For the next 24 hours, you can use MyChart to ask questions about today's visit, request a non-urgent call back, or ask for a work or school excuse from your e-visit provider. You will get an email in the next two days asking about your experience. I hope that your e-visit has been valuable and will speed your recovery.  I have spent at least 5 minutes reviewing and documenting in the patient's chart.

## 2020-09-06 ENCOUNTER — Encounter: Payer: Self-pay | Admitting: Family Medicine

## 2020-09-06 ENCOUNTER — Telehealth (INDEPENDENT_AMBULATORY_CARE_PROVIDER_SITE_OTHER): Payer: 59 | Admitting: Family Medicine

## 2020-09-06 VITALS — Ht 61.0 in | Wt 143.0 lb

## 2020-09-06 DIAGNOSIS — J321 Chronic frontal sinusitis: Secondary | ICD-10-CM | POA: Diagnosis not present

## 2020-09-06 MED ORDER — CLARITHROMYCIN ER 500 MG PO TB24: 1000.0000 mg | ORAL_TABLET | Freq: Every day | ORAL | 0 refills | Status: AC

## 2020-09-06 NOTE — Progress Notes (Signed)
Established Patient Office Visit  Subjective:  Patient ID: [name], female    DOB: 07/17/1971  Age: 49 y.o. MRN: [id number]  CC:  Chief Complaint  Patient presents with  . Sinusitis    head and ears feel full, little cough x 3 weeks.     HPI [name] presents for a 3-week history nasal congestion postnasal drip itchy ears, nose and throat.  There has been no fever.  She does have a history of fall allergies.  She had called for an ED visit 2 weeks ago and Flonase and Singulair were added to the cetirizine that she had already been taking.  These drugs seem to have helped but over the last few days she has developed increasing facial pressure in her forehead and cheekbones.  There has been scant green rhinorrhea.  She has been using her Flonase over the last few weeks faithfully.  She does smoke a few cigarettes daily.  Of concern she was treated for sinusitis a few months ago with Augmentin.  Prednisone in the past and caused her legs to go numb she tells me.  She is otherwise in good health.  Past Medical History:  Diagnosis Date  . Allergy   . Anemia    hx  . Chronic female pelvic pain    improved after hysterectomy  . Cyst of left kidney    rt and lft  . Dry eye syndrome    and eczema  . GERD (gastroesophageal reflux disease)   . History of kidney stones   . History of nephrolithiasis   . History of syncope    during pregnancy    Past Surgical History:  Procedure Laterality Date  . APPENDECTOMY    . CHOLECYSTECTOMY N/A 05/26/2017   Procedure: LAPAROSCOPIC CHOLECYSTECTOMY;  Surgeon: Coralie Keens, MD;  Location: [facility];  Service: General;  Laterality: N/A;  . COLONOSCOPY  2003   Dr. Isa Rankin  . TONSILLECTOMY    . UPPER GASTROINTESTINAL ENDOSCOPY    . VAGINAL HYSTERECTOMY      Family History  Problem Relation Age of Onset  . Uterine cancer Mother   . Colon polyps Father   . Diabetes type II Paternal Grandfather   . Coronary artery disease Paternal  Grandfather   . Hyperlipidemia Paternal Grandfather   . Hypertension Other        maternal & Paternal grandfather  . Stomach cancer Other   . Breast cancer Neg Hx   . Colon cancer Neg Hx   . Rectal cancer Neg Hx   . Pancreatic cancer Neg Hx     Social History   Socioeconomic History  . Marital status: Married    Spouse name: Not on file  . Number of children: Not on file  . Years of education: Not on file  . Highest education level: Not on file  Occupational History  . Not on file  Tobacco Use  . Smoking status: Current Every Day Smoker    Packs/day: 0.50    Years: 10.00    Pack years: 5.00    Types: Cigarettes  . Smokeless tobacco: Never Used  . Tobacco comment: 5 cigarettes daily  Substance and Sexual Activity  . Alcohol use: Yes    Alcohol/week: 0.0 standard drinks    Comment: rarely  . Drug use: No  . Sexual activity: Not on file  Other Topics Concern  . Not on file  Social History Narrative   Education officer, environmental for [employer]  Married 6 years   Children-teenagers   Social Determinants of Health   Financial Resource Strain:   . Difficulty of Paying Living Expenses: Not on file  Food Insecurity:   . Worried About Charity fundraiser in the Last Year: Not on file  . Ran Out of Food in the Last Year: Not on file  Transportation Needs:   . Lack of Transportation (Medical): Not on file  . Lack of Transportation (Non-Medical): Not on file  Physical Activity:   . Days of Exercise per Week: Not on file  . Minutes of Exercise per Session: Not on file  Stress:   . Feeling of Stress : Not on file  Social Connections:   . Frequency of Communication with Friends and Family: Not on file  . Frequency of Social Gatherings with Friends and Family: Not on file  . Attends Religious Services: Not on file  . Active Member of Clubs or Organizations: Not on file  . Attends Archivist Meetings: Not on file  . Marital Status: Not on file  Intimate Partner Violence:     . Fear of Current or Ex-Partner: Not on file  . Emotionally Abused: Not on file  . Physically Abused: Not on file  . Sexually Abused: Not on file    Outpatient Medications Prior to Visit  Medication Sig Dispense Refill  . cetirizine (ZYRTEC) 10 MG tablet Take 10 mg by mouth at bedtime.     . fluticasone (FLONASE) 50 MCG/ACT nasal spray Place 2 sprays into both nostrils daily for 10 days. 16 g 0  . montelukast (SINGULAIR) 10 MG tablet Take 1 tablet (10 mg total) by mouth at bedtime. 30 tablet 0  . amoxicillin-clavulanate (AUGMENTIN) 875-125 MG tablet Take 1 tablet by mouth every 12 (twelve) hours. (Patient not taking: Reported on 09/06/2020) 14 tablet 0  . nicotine (NICODERM CQ - DOSED IN MG/24 HOURS) 21 mg/24hr patch Place 1 patch (21 mg total) onto the skin daily. (Patient not taking: Reported on 09/06/2020) 28 patch 0  . valACYclovir (VALTREX) 1000 MG tablet Take 1,000 mg by mouth 2 (two) times daily.     No facility-administered medications prior to visit.    Allergies  Allergen Reactions  . Morphine And Related     Nausea and vomiting    ROS Review of Systems  Constitutional: Negative for chills, diaphoresis, fatigue, fever and unexpected weight change.  HENT: Positive for congestion, postnasal drip, rhinorrhea, sinus pressure, sinus pain and sneezing.   Eyes: Negative for photophobia and visual disturbance.  Respiratory: Positive for cough. Negative for shortness of breath and wheezing.   Cardiovascular: Negative.   Gastrointestinal: Negative.   Allergic/Immunologic: Negative for immunocompromised state.  Neurological: Positive for headaches.  Psychiatric/Behavioral: Negative.       Objective:    Physical Exam Vitals and nursing note reviewed.  Constitutional:      General: She is not in acute distress.    Appearance: Normal appearance. She is not ill-appearing, toxic-appearing or diaphoretic.  HENT:     Head: Normocephalic and atraumatic.     Right Ear: External  ear normal.     Left Ear: External ear normal.  Eyes:     General:        Right eye: No discharge.        Left eye: No discharge.     Conjunctiva/sclera: Conjunctivae normal.  Pulmonary:     Effort: Pulmonary effort is normal.  Neurological:     Mental Status: She  is alert and oriented to person, place, and time.  Psychiatric:        Mood and Affect: Mood normal.        Behavior: Behavior normal.     Ht 5\' 1"  (1.549 m)   Wt 143 lb (64.9 kg)   BMI 27.02 kg/m  Wt Readings from Last 3 Encounters:  09/06/20 143 lb (64.9 kg)  06/17/20 150 lb (68 kg)  04/12/20 150 lb (68 kg)     Health Maintenance Due  Topic Date Due  . Hepatitis C Screening  Never done  . COVID-19 Vaccine (1) Never done  . HIV Screening  Never done  . TETANUS/TDAP  11/05/2018  . PAP SMEAR-Modifier  12/31/2019  . MAMMOGRAM  04/15/2020  . INFLUENZA VACCINE  07/28/2020    There are no preventive care reminders to display for this patient.  Lab Results  Component Value Date   TSH 0.92 10/09/2016   Lab Results  Component Value Date   WBC 12.2 (H) 04/10/2020   HGB 14.3 04/10/2020   HCT 43.2 04/10/2020   MCV 95.8 04/10/2020   PLT 287 04/10/2020   Lab Results  Component Value Date   NA 136 04/12/2020   K 4.0 04/12/2020   CO2 22 04/12/2020   GLUCOSE 87 04/12/2020   BUN 12 04/12/2020   CREATININE 0.76 04/12/2020   BILITOT 0.6 05/15/2017   ALKPHOS 61 05/15/2017   AST 16 05/15/2017   ALT 14 05/15/2017   PROT 6.8 05/15/2017   ALBUMIN 3.9 05/15/2017   CALCIUM 9.6 04/12/2020   ANIONGAP 11 04/10/2020   GFR 87.79 12/23/2016   Lab Results  Component Value Date   CHOL 136 10/09/2016   Lab Results  Component Value Date   HDL 45.90 10/09/2016   Lab Results  Component Value Date   LDLCALC 78 10/09/2016   Lab Results  Component Value Date   TRIG 63.0 10/09/2016   Lab Results  Component Value Date   CHOLHDL 3 10/09/2016   Lab Results  Component Value Date   HGBA1C 5.6 08/31/2012        Assessment & Plan:   Problem List Items Addressed This Visit    None    Visit Diagnoses    Frontal sinusitis, unspecified chronicity    -  Primary   Relevant Medications   valACYclovir (VALTREX) 1000 MG tablet   clarithromycin (BIAXIN XL) 500 MG 24 hr tablet      Meds ordered this encounter  Medications  . clarithromycin (BIAXIN XL) 500 MG 24 hr tablet    Sig: Take 2 tablets (1,000 mg total) by mouth daily for 10 days.    Dispense:  20 tablet    Refill:  0    Follow-up: No follow-ups on file.  Will try Biaxin for 10 days.  Advised to follow-up with her primary.  May benefit from imaging studies of her sinuses. Advised tobacco cessation.    Libby Maw, MD   Virtual Visit via Video Note  I connected with Josepha Pigg on 09/06/20 at  4:00 PM EDT by a video enabled telemedicine application and verified that I am speaking with the correct person using two identifiers.  Location: Patient: home with several family members.   Provider:    I discussed the limitations of evaluation and management by telemedicine and the availability of in person appointments. The patient expressed understanding and agreed to proceed.  History of Present Illness:    Observations/Objective:   Assessment and Plan:  Follow Up Instructions:    I discussed the assessment and treatment plan with the patient. The patient was provided an opportunity to ask questions and all were answered. The patient agreed with the plan and demonstrated an understanding of the instructions.   The patient was advised to call back or seek an in-person evaluation if the symptoms worsen or if the condition fails to improve as anticipated.  I provided 24 minutes of non-face-to-face time during this encounter.   Libby Maw, MD

## 2020-09-16 DIAGNOSIS — Z720 Tobacco use: Secondary | ICD-10-CM | POA: Diagnosis not present

## 2020-09-16 DIAGNOSIS — Z1389 Encounter for screening for other disorder: Secondary | ICD-10-CM | POA: Diagnosis not present

## 2020-09-16 DIAGNOSIS — Z01419 Encounter for gynecological examination (general) (routine) without abnormal findings: Secondary | ICD-10-CM | POA: Diagnosis not present

## 2020-09-16 DIAGNOSIS — Z1231 Encounter for screening mammogram for malignant neoplasm of breast: Secondary | ICD-10-CM | POA: Diagnosis not present

## 2020-09-16 DIAGNOSIS — Z6827 Body mass index (BMI) 27.0-27.9, adult: Secondary | ICD-10-CM | POA: Diagnosis not present

## 2020-09-16 DIAGNOSIS — Z130 Encounter for screening for diseases of the blood and blood-forming organs and certain disorders involving the immune mechanism: Secondary | ICD-10-CM | POA: Diagnosis not present

## 2020-09-25 MED FILL — NICOTINE 14 MG/24HR PATCH: 14 | 28 days supply | Qty: 28 | Fill #0

## 2020-10-01 ENCOUNTER — Encounter: Payer: Self-pay | Admitting: Oncology

## 2020-10-01 ENCOUNTER — Other Ambulatory Visit: Payer: Self-pay | Admitting: Oncology

## 2020-10-01 DIAGNOSIS — U071 COVID-19: Secondary | ICD-10-CM

## 2020-10-01 NOTE — Progress Notes (Signed)
I connected by phone with  [name] to discuss the potential use of an new treatment for mild to moderate COVID-19 viral infection in non-hospitalized patients.   This patient is a age/sex that meets the FDA criteria for Emergency Use Authorization of casirivimab\imdevimab.  Has a (+) direct SARS-CoV-2 viral test result 1. Has mild or moderate COVID-19  2. Is ? 51 years of age and weighs ? 40 kg 3. Is NOT hospitalized due to COVID-19 4. Is NOT requiring oxygen therapy or requiring an increase in baseline oxygen flow rate due to COVID-19 5. Is within 10 days of symptom onset 6. Has at least one of the high risk factor(s) for progression to severe COVID-19 and/or hospitalization as defined in EUA. Specific high risk criteria : Past Medical History:  Diagnosis Date  . Allergy   . Anemia    hx  . Chronic female pelvic pain    improved after hysterectomy  . Cyst of left kidney    rt and lft  . Dry eye syndrome    and eczema  . GERD (gastroesophageal reflux disease)   . History of kidney stones   . History of nephrolithiasis   . History of syncope    during pregnancy  ?  ?    Symptom onset 09/29/2020   I have spoken and communicated the following to the patient or parent/caregiver:   1. FDA has authorized the emergency use of casirivimab\imdevimab for the treatment of mild to moderate COVID-19 in adults and pediatric patients with positive results of direct SARS-CoV-2 viral testing who are 59 years of age and older weighing at least 40 kg, and who are at high risk for progressing to severe COVID-19 and/or hospitalization.   2. The significant known and potential risks and benefits of casirivimab\imdevimab, and the extent to which such potential risks and benefits are unknown.   3. Information on available alternative treatments and the risks and benefits of those alternatives, including clinical trials.   4. Patients treated with casirivimab\imdevimab should continue to  self-isolate and use infection control measures (e.g., wear mask, isolate, social distance, avoid sharing personal items, clean and disinfect "high touch" surfaces, and frequent handwashing) according to CDC guidelines.    5. The patient or parent/caregiver has the option to accept or refuse casirivimab\imdevimab .   After reviewing this information with the patient, The patient agreed to proceed with receiving casirivimab\imdevimab infusion and will be provided a copy of the Fact sheet prior to receiving the infusion.Rulon Abide, AGNP-C [phone] ([place])

## 2020-10-02 ENCOUNTER — Ambulatory Visit (HOSPITAL_COMMUNITY)
Admission: RE | Admit: 2020-10-02 | Discharge: 2020-10-02 | Disposition: A | Discharge status: Home / Self Care | Payer: 59 | Source: Ambulatory Visit | Attending: Pulmonary Disease | Admitting: Pulmonary Disease

## 2020-10-02 ENCOUNTER — Other Ambulatory Visit (HOSPITAL_COMMUNITY): Payer: Self-pay

## 2020-10-02 DIAGNOSIS — U071 COVID-19: Secondary | ICD-10-CM | POA: Diagnosis not present

## 2020-10-02 MED ORDER — DIPHENHYDRAMINE HCL 50 MG/ML IJ SOLN: 50.0000 mg | Freq: Once | INTRAMUSCULAR | Status: DC | PRN

## 2020-10-02 MED ORDER — SODIUM CHLORIDE 0.9 % IV SOLN
1200.0000 mg | Freq: Once | INTRAVENOUS | Status: AC
  Administered 2020-10-02: 1200 mg via INTRAVENOUS

## 2020-10-02 MED ORDER — FAMOTIDINE IN NACL 20-0.9 MG/50ML-% IV SOLN: 20.0000 mg | Freq: Once | INTRAVENOUS | Status: DC | PRN

## 2020-10-02 MED ORDER — ALBUTEROL SULFATE HFA 108 (90 BASE) MCG/ACT IN AERS: 2.0000 | INHALATION_SPRAY | Freq: Once | RESPIRATORY_TRACT | Status: DC | PRN

## 2020-10-02 MED ORDER — SODIUM CHLORIDE 0.9 % IV SOLN: INTRAVENOUS | Status: DC | PRN

## 2020-10-02 MED ORDER — METHYLPREDNISOLONE SODIUM SUCC 125 MG IJ SOLR: 125.0000 mg | Freq: Once | INTRAMUSCULAR | Status: DC | PRN

## 2020-10-02 MED ORDER — EPINEPHRINE 0.3 MG/0.3ML IJ SOAJ: 0.3000 mg | Freq: Once | INTRAMUSCULAR | Status: DC | PRN

## 2020-10-02 NOTE — Progress Notes (Signed)
  Diagnosis: COVID-19  Physician: Dr Joya Gaskins  Procedure: [facility] Med: casirivimab\imdevimab infusion - Provided patient with casirivimab\imdevimab fact sheet for patients, parents and caregivers prior to infusion.  Complications: No immediate complications noted.  Discharge: Discharged home   [place], [place] 10/02/2020

## 2020-10-02 NOTE — Discharge Instructions (Signed)

## 2020-10-04 DIAGNOSIS — H9209 Otalgia, unspecified ear: Secondary | ICD-10-CM | POA: Diagnosis not present

## 2020-10-07 DIAGNOSIS — H60393 Other infective otitis externa, bilateral: Secondary | ICD-10-CM | POA: Diagnosis not present

## 2021-10-03 LAB — HM PAP SMEAR: HM Pap smear: NORMAL

## 2021-10-14 ENCOUNTER — Other Ambulatory Visit: Payer: Self-pay | Admitting: Obstetrics and Gynecology

## 2021-10-14 DIAGNOSIS — R928 Other abnormal and inconclusive findings on diagnostic imaging of breast: Secondary | ICD-10-CM

## 2021-11-03 ENCOUNTER — Other Ambulatory Visit: Payer: Self-pay

## 2021-11-03 ENCOUNTER — Other Ambulatory Visit: Payer: Self-pay | Admitting: Obstetrics and Gynecology

## 2021-11-03 ENCOUNTER — Ambulatory Visit
Admission: RE | Admit: 2021-11-03 | Discharge: 2021-11-03 | Disposition: A | Discharge status: Home / Self Care | Payer: 59 | Source: Ambulatory Visit | Attending: Obstetrics and Gynecology | Admitting: Obstetrics and Gynecology

## 2021-11-03 DIAGNOSIS — R928 Other abnormal and inconclusive findings on diagnostic imaging of breast: Secondary | ICD-10-CM

## 2021-11-03 DIAGNOSIS — N6310 Unspecified lump in the right breast, unspecified quadrant: Secondary | ICD-10-CM

## 2021-11-12 ENCOUNTER — Ambulatory Visit
Admission: RE | Admit: 2021-11-12 | Discharge: 2021-11-12 | Disposition: A | Discharge status: Home / Self Care | Payer: 59 | Source: Ambulatory Visit | Attending: Obstetrics and Gynecology | Admitting: Obstetrics and Gynecology

## 2021-11-12 ENCOUNTER — Other Ambulatory Visit: Payer: Self-pay

## 2021-11-12 ENCOUNTER — Other Ambulatory Visit: Payer: Self-pay | Admitting: Obstetrics and Gynecology

## 2021-11-12 DIAGNOSIS — N6310 Unspecified lump in the right breast, unspecified quadrant: Secondary | ICD-10-CM

## 2021-11-12 HISTORY — PX: BREAST CYST ASPIRATION: SHX578

## 2022-03-10 ENCOUNTER — Telehealth: Payer: Self-pay | Admitting: Family Medicine

## 2022-03-10 DIAGNOSIS — J019 Acute sinusitis, unspecified: Secondary | ICD-10-CM

## 2022-03-10 DIAGNOSIS — B9689 Other specified bacterial agents as the cause of diseases classified elsewhere: Secondary | ICD-10-CM

## 2022-03-10 MED ORDER — AMOXICILLIN-POT CLAVULANATE 875-125 MG PO TABS: 1.0000 | ORAL_TABLET | Freq: Two times a day (BID) | ORAL | 0 refills | Status: AC

## 2022-03-10 NOTE — Progress Notes (Signed)

## 2022-06-18 ENCOUNTER — Other Ambulatory Visit (HOSPITAL_COMMUNITY): Payer: Self-pay

## 2022-06-18 ENCOUNTER — Ambulatory Visit
Admission: EM | Admit: 2022-06-18 | Discharge: 2022-06-18 | Disposition: A | Discharge status: Home / Self Care | Payer: PRIVATE HEALTH INSURANCE | Attending: Emergency Medicine | Admitting: Emergency Medicine

## 2022-06-18 DIAGNOSIS — B349 Viral infection, unspecified: Secondary | ICD-10-CM

## 2022-06-18 DIAGNOSIS — M545 Low back pain, unspecified: Secondary | ICD-10-CM | POA: Diagnosis not present

## 2022-06-18 DIAGNOSIS — J309 Allergic rhinitis, unspecified: Secondary | ICD-10-CM

## 2022-06-18 LAB — POCT URINALYSIS DIP (MANUAL ENTRY)
Bilirubin, UA: NEGATIVE
Glucose, UA: NEGATIVE mg/dL
Ketones, POC UA: NEGATIVE mg/dL
Leukocytes, UA: NEGATIVE
Nitrite, UA: NEGATIVE
Protein Ur, POC: NEGATIVE mg/dL
Spec Grav, UA: 1.01 (ref 1.010–1.025)
Urobilinogen, UA: 0.2 E.U./dL
pH, UA: 6.5 (ref 5.0–8.0)

## 2022-06-18 MED ORDER — IBUPROFEN 400 MG PO TABS
400.0000 mg | ORAL_TABLET | Freq: Three times a day (TID) | ORAL | 0 refills | Status: AC | PRN
  Filled 2022-06-18: qty 30, 10d supply, fill #0

## 2022-06-18 MED ORDER — BACLOFEN 10 MG PO TABS
10.0000 mg | ORAL_TABLET | Freq: Every day | ORAL | 0 refills | Status: AC
  Filled 2022-06-18: qty 7, 7d supply, fill #0

## 2022-06-18 MED ORDER — FLUTICASONE PROPIONATE 50 MCG/ACT NA SUSP
1.0000 | Freq: Every day | NASAL | 1 refills | Status: AC
  Filled 2022-06-18: qty 16, 30d supply, fill #0

## 2022-06-18 MED ORDER — DICLOFENAC SODIUM 1 % EX GEL
4.0000 g | Freq: Four times a day (QID) | CUTANEOUS | 2 refills | Status: DC
  Filled 2022-06-18: qty 100, 25d supply, fill #0

## 2022-06-18 MED ORDER — CETIRIZINE HCL 10 MG PO TABS
10.0000 mg | ORAL_TABLET | Freq: Every day | ORAL | 1 refills | Status: AC
  Filled 2022-06-18: qty 30, 30d supply, fill #0

## 2022-06-18 NOTE — Discharge Instructions (Signed)
Your symptoms and physical exam findings are concerning for a viral respiratory infection.  Because respiratory allergies are not well controlled at this time, this makes you more susceptible to catching respiratory infections.  Please see the list below for recommended medications, dosages and frequencies to provide relief of current symptoms:     Zyrtec (cetirizine): This is an excellent second-generation antihistamine that helps to reduce respiratory inflammatory response to environmental allergens.  In some patients, this medication can cause daytime sleepiness so I recommend that you take 1 tablet daily at bedtime.     Flonase (fluticasone): This is a steroid nasal spray that you use once daily, 1 spray in each nare.  This medication does not work well if you decide to use it only used as you feel you need to, it works best used on a daily basis.  After 3 to 5 days of use, you will notice significant reduction of the inflammation and mucus production that is currently being caused by exposure to allergens, whether seasonal or environmental.  The most common side effect of this medication is nosebleeds.  If you experience a nosebleed, please discontinue use for 1 week, then feel free to resume.  I have provided you with a prescription.      Your urinalysis today was entirely unremarkable and not concerning for kidney stone, infection or cancer at this time.   The mainstay of therapy for musculoskeletal pain is reduction of inflammation and relaxation of tension which is causing inflammation.  Keep in mind, pain always begets more pain.  To help you stay ahead of your pain and inflammation, I have provided the following regimen for you:  Please begin taking ibuprofen 400 mg 3 times daily.  Please keep in mind that it is always easier to treat a little bit of pain that is to treat a lot of pain.  I recommend that for the next several days, you take this medication on a scheduled basis.  After that,  take it when you begin to feel the pain returning, do not wait until you are in a lot of pain.   This evening, please begin taking baclofen 10 mg.  This is a highly effective muscle relaxer and antispasmodic which should continue to provide you with relaxation of your tense muscles, allow you to sleep well and to keep your pain under control.   During the day, please set aside time to apply ice to the affected area 4 times daily for 20 minutes each application.  This can be achieved by using a bag of frozen peas or corn, a Ziploc bag filled with ice and water, or Ziploc bag filled with half rubbing alcohol and half Dawn dish detergent, frozen into a slush.  Please be careful not to apply ice directly to your skin, always place a soft cloth between you and the ice pack.   You are welcome to use topical anti-inflammatory creams such as Voltaren gel, capsaicin or Aspercreme as recommended.  These medications are available over-the-counter, please follow manufactures instructions for use.  As a courtesy, I provided you with a prescription for diclofenac in the event that your insurance will pay for this.   Please consider discussing referral to physical therapy with your primary care provider.  Physical therapist are very good at teasing out the underlying cause of acute lower back pain and helping with prevention of future recurrences.   Please avoid attempts to stretch or strengthen the affected area until you are feeling completely  pain-free.  Attempts to do so will only prolong the healing process.   Thank you for visiting urgent care today.  We appreciate the opportunity to participate in your care.

## 2022-06-18 NOTE — ED Provider Notes (Signed)
[facility]    CSN: [id number] Arrival date & time: 06/18/22  1138    HISTORY   Chief Complaint  Patient presents with   Generalized Body Aches   Sore Throat   Chills   HPI [name] is a 51 y.o. female. Patient presents urgent care today complaining of 3-day history of body aches, chills and sore throat.  Patient states she has not had fever that she knows of, states she did not check but also did not feel hot.  Patient states her body aches are mostly in her legs and lower back.  Patient also reports a history of on and off back pain for the past 3 years.  Patient states she has been taking NyQuil with no relief of her symptoms.  Patient states she was recently exposed to her granddaughter who had hand-foot-and-mouth disease and her son who had a vague viral illness for which she does not believe he ever sought medical attention.  Patient states she is also concerned that her lower back pain, because it is intermittent, could either be kidney cancer or a kidney stone.  Patient is well-appearing, is in no acute distress at this time and had normal vital signs on arrival.  The history is provided by the patient.   Past Medical History:  Diagnosis Date   Allergy    Anemia    hx   Chronic female pelvic pain    improved after hysterectomy   Cyst of left kidney    rt and lft   Dry eye syndrome    and eczema   GERD (gastroesophageal reflux disease)    History of kidney stones    History of nephrolithiasis    History of syncope    during pregnancy   Patient Active Problem List   Diagnosis Date Noted   Abnormal EKG 04/12/2020   Chest pain of uncertain etiology 64/33/2951   Myofascial pain 04/11/2020   Cervical radiculopathy 04/11/2020   Trapezius strain 11/30/2014   Migraine 11/16/2014   Migraines 11/08/2014   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 Isa Rankin   TONSILLECTOMY     UPPER GASTROINTESTINAL ENDOSCOPY     VAGINAL HYSTERECTOMY     OB History   No obstetric history on file.    Home Medications    Prior to Admission medications   Medication Sig Start Date End Date Taking? Authorizing Provider  cetirizine (ZYRTEC) 10 MG tablet Take 10 mg by mouth at bedtime.     [provider]  fluticasone (FLONASE) 50 MCG/ACT nasal spray Place 2 sprays into both nostrils daily for 10 days. 08/23/20 09/06/20  Leath-Warren, Alda Lea, NP  montelukast (SINGULAIR) 10 MG tablet Take 1 tablet (10 mg total) by mouth at bedtime. 08/23/20 09/22/20  Leath-Warren, Alda Lea, NP  nicotine (NICODERM CQ - DOSED IN MG/24 HOURS) 21 mg/24hr patch Place 1 patch (21 mg total) onto the skin daily. Patient not  taking: Reported on 09/06/2020 04/10/20   Saguier, [name], PA-C  valACYclovir (VALTREX) 1000 MG tablet Take 1,000 mg by mouth 2 (two) times daily. 07/23/20   [provider]   Family History Family History  Problem Relation Age of Onset   Uterine cancer Mother    Colon polyps Father    Diabetes type II Paternal Grandfather    Coronary artery disease Paternal Grandfather    Hyperlipidemia Paternal Grandfather    Hypertension Other        maternal & Paternal grandfather   Stomach cancer Other    Breast cancer Neg Hx    Colon cancer Neg Hx    Rectal cancer Neg Hx    Pancreatic cancer Neg Hx    Social History Social History   Tobacco Use   Smoking status: Every Day    Packs/day: 0.50     Years: 10.00    Total pack years: 5.00    Types: Cigarettes   Smokeless tobacco: Never   Tobacco comments:    5 cigarettes daily  Substance Use Topics   Alcohol use: Yes    Alcohol/week: 0.0 standard drinks of alcohol    Comment: rarely   Drug use: No   Allergies   Morphine and related  Review of Systems Review of Systems Pertinent findings noted in history of present illness.   Physical Exam Triage Vital Signs ED Triage Vitals  Enc Vitals Group     BP 10/24/21 0827 (!) 147/82     Pulse Rate 10/24/21 0827 72     Resp 10/24/21 0827 18     Temp 10/24/21 0827 98.3 F (36.8 C)     Temp Source 10/24/21 0827 Oral     SpO2 10/24/21 0827 98 %     Weight --      Height --      Head Circumference --      Peak Flow --      Pain Score 10/24/21 0826 5     Pain Loc --      Pain Edu? --      Excl. in [place]? --   No data found.  Updated Vital Signs BP 103/68 (BP Location: Left Arm)   Pulse 86   Temp 98.2 F (36.8 C) (Oral)   Resp 18   SpO2 98%   Physical Exam Vitals and nursing note reviewed.  Constitutional:      General: She is not in acute distress.    Appearance: Normal appearance. She is not ill-appearing.  HENT:     Head: Normocephalic and atraumatic.     Salivary Glands: Right salivary gland is not diffusely enlarged or tender. Left salivary gland is not diffusely enlarged or tender.     Right Ear: Ear canal and external ear normal. No drainage. A middle ear effusion is present. There is no impacted cerumen. Tympanic membrane is bulging. Tympanic membrane is not injected or erythematous.     Left Ear: Ear canal and external ear normal. No drainage. A middle ear effusion is present. There is no impacted cerumen. Tympanic membrane is bulging. Tympanic membrane is not injected or erythematous.     Ears:     Comments: Bilateral EACs normal, both TMs bulging with clear fluid    Nose: Rhinorrhea present. No nasal deformity, septal deviation, signs of injury, nasal  tenderness, mucosal edema or congestion. Rhinorrhea is clear.     Right Nostril: Occlusion present. No foreign body, epistaxis or septal hematoma.     Left  Nostril: Occlusion present. No foreign body, epistaxis or septal hematoma.     Right Turbinates: Enlarged, swollen and pale.     Left Turbinates: Enlarged, swollen and pale.     Right Sinus: No maxillary sinus tenderness or frontal sinus tenderness.     Left Sinus: No maxillary sinus tenderness or frontal sinus tenderness.     Mouth/Throat:     Lips: Pink. No lesions.     Mouth: Mucous membranes are moist. No oral lesions.     Pharynx: Oropharynx is clear. Uvula midline. No posterior oropharyngeal erythema or uvula swelling.     Tonsils: No tonsillar exudate. 0 on the right. 0 on the left.     Comments: Postnasal drip Eyes:     General: Lids are normal.        Right eye: No discharge.        Left eye: No discharge.     Extraocular Movements: Extraocular movements intact.     Conjunctiva/sclera: Conjunctivae normal.     Right eye: Right conjunctiva is not injected.     Left eye: Left conjunctiva is not injected.  Neck:     Trachea: Trachea and phonation normal.  Cardiovascular:     Rate and Rhythm: Normal rate and regular rhythm.     Pulses: Normal pulses.     Heart sounds: Normal heart sounds. No murmur heard.    No friction rub. No gallop.  Pulmonary:     Effort: Pulmonary effort is normal. No accessory muscle usage, prolonged expiration or respiratory distress.     Breath sounds: Normal breath sounds. No stridor, decreased air movement or transmitted upper airway sounds. No decreased breath sounds, wheezing, rhonchi or rales.  Chest:     Chest wall: No tenderness.  Abdominal:     General: Abdomen is flat. Bowel sounds are normal. There is no distension.     Palpations: Abdomen is soft.     Tenderness: There is no abdominal tenderness. There is no right CVA tenderness or left CVA tenderness.     Hernia: No hernia is present.   Musculoskeletal:        General: Normal range of motion.     Cervical back: Normal range of motion and neck supple. Normal range of motion.  Lymphadenopathy:     Cervical: No cervical adenopathy.  Skin:    General: Skin is warm and dry.     Findings: No erythema or rash.  Neurological:     General: No focal deficit present.     Mental Status: She is alert and oriented to person, place, and time.  Psychiatric:        Mood and Affect: Mood normal.        Behavior: Behavior normal.     Visual Acuity Right Eye Distance:   Left Eye Distance:   Bilateral Distance:    Right Eye Near:   Left Eye Near:    Bilateral Near:     UC Couse / Diagnostics / Procedures:    EKG  Radiology No results found.  Procedures Procedures (including critical care time)  UC Diagnoses / Final Clinical Impressions(s)   I have reviewed the triage vital signs and the nursing notes.  Pertinent labs & imaging results that were available during my care of the patient were reviewed by me and considered in my medical decision making (see chart for details).   Final diagnoses:  Viral illness  Allergic rhinitis, unspecified seasonality, unspecified trigger  Acute right-sided low back pain without  sciatica   Physical exam findings concerning for allergic rhinitis, do not see any indications of viral or bacterial infection otherwise.  Patient also had a small amount of tenderness around the right SI joint with a little bit of muscle spasm.  Please see discharge instructions below for details of recommended plan of care.  Return precautions advised.  ED Prescriptions     Medication Sig Dispense Auth. Provider   fluticasone (FLONASE) 50 MCG/ACT nasal spray Place 1 spray into both nostrils daily. 47.4 mL Lynden Oxford Scales, PA-C   cetirizine (ZYRTEC ALLERGY) 10 MG tablet Take 1 tablet (10 mg total) by mouth at bedtime. 90 tablet Lynden Oxford Scales, PA-C   ibuprofen (ADVIL) 400 MG tablet Take 1 tablet  (400 mg total) by mouth every 8 (eight) hours as needed for up to 30 doses. 30 tablet Lynden Oxford Scales, PA-C   diclofenac Sodium (VOLTAREN) 1 % GEL Apply 4 g topically 4 (four) times daily. Apply to affected areas 4 times daily as needed for pain. 100 g Lynden Oxford Scales, PA-C   baclofen (LIORESAL) 10 MG tablet Take 1 tablet (10 mg total) by mouth at bedtime for 7 days. 7 tablet Lynden Oxford Scales, PA-C      PDMP not reviewed this encounter.  Pending results:  Labs Reviewed  POCT URINALYSIS DIP (MANUAL ENTRY) - Abnormal; Notable for the following components:      Result Value   Blood, UA trace-intact (*)    All other components within normal limits    Medications Ordered in UC: Medications - No data to display  Disposition Upon Discharge:  Condition: stable for discharge home Home: take medications as prescribed; routine discharge instructions as discussed; follow up as advised.  Patient presented with an acute illness with associated systemic symptoms and significant discomfort requiring urgent management. In my opinion, this is a condition that a prudent lay person (someone who possesses an average knowledge of health and medicine) may potentially expect to result in complications if not addressed urgently such as respiratory distress, impairment of bodily function or dysfunction of bodily organs.   Routine symptom specific, illness specific and/or disease specific instructions were discussed with the patient and/or caregiver at length.   As such, the patient has been evaluated and assessed, work-up was performed and treatment was provided in alignment with urgent care protocols and evidence based medicine.  Patient/parent/caregiver has been advised that the patient may require follow up for further testing and treatment if the symptoms continue in spite of treatment, as clinically indicated and appropriate.  If the patient was tested for COVID-19, Influenza and/or RSV,  then the patient/parent/guardian was advised to isolate at home pending the results of his/her diagnostic coronavirus test and potentially longer if they're positive. I have also advised pt that if his/her COVID-19 test returns positive, it's recommended to self-isolate for at least 10 days after symptoms first appeared AND until fever-free for 24 hours without fever reducer AND other symptoms have improved or resolved. Discussed self-isolation recommendations as well as instructions for household member/close contacts as per the [facility] and  DHHS, and also gave patient the [place] packet with this information.  Patient/parent/caregiver has been advised to return to the [facility] or PCP in 3-5 days if no better; to PCP or the Emergency Department if new signs and symptoms develop, or if the current signs or symptoms continue to change or worsen for further workup, evaluation and treatment as clinically indicated and appropriate  The patient will follow up  with their current PCP if and as advised. If the patient does not currently have a PCP we will assist them in obtaining one.   The patient may need specialty follow up if the symptoms continue, in spite of conservative treatment and management, for further workup, evaluation, consultation and treatment as clinically indicated and appropriate.  Patient/parent/caregiver verbalized understanding and agreement of plan as discussed.  All questions were addressed during visit.  Please see discharge instructions below for further details of plan.  Discharge Instructions:   Discharge Instructions      Your symptoms and physical exam findings are concerning for a viral respiratory infection.  Because respiratory allergies are not well controlled at this time, this makes you more susceptible to catching respiratory infections.  Please see the list below for recommended medications, dosages and frequencies to provide relief of current symptoms:     Zyrtec  (cetirizine): This is an excellent second-generation antihistamine that helps to reduce respiratory inflammatory response to environmental allergens.  In some patients, this medication can cause daytime sleepiness so I recommend that you take 1 tablet daily at bedtime.     Flonase (fluticasone): This is a steroid nasal spray that you use once daily, 1 spray in each nare.  This medication does not work well if you decide to use it only used as you feel you need to, it works best used on a daily basis.  After 3 to 5 days of use, you will notice significant reduction of the inflammation and mucus production that is currently being caused by exposure to allergens, whether seasonal or environmental.  The most common side effect of this medication is nosebleeds.  If you experience a nosebleed, please discontinue use for 1 week, then feel free to resume.  I have provided you with a prescription.      Your urinalysis today was entirely unremarkable and not concerning for kidney stone, infection or cancer at this time.   The mainstay of therapy for musculoskeletal pain is reduction of inflammation and relaxation of tension which is causing inflammation.  Keep in mind, pain always begets more pain.  To help you stay ahead of your pain and inflammation, I have provided the following regimen for you:  Please begin taking ibuprofen 400 mg 3 times daily.  Please keep in mind that it is always easier to treat a little bit of pain that is to treat a lot of pain.  I recommend that for the next several days, you take this medication on a scheduled basis.  After that, take it when you begin to feel the pain returning, do not wait until you are in a lot of pain.   This evening, please begin taking baclofen 10 mg.  This is a highly effective muscle relaxer and antispasmodic which should continue to provide you with relaxation of your tense muscles, allow you to sleep well and to keep your pain under control.   During the  day, please set aside time to apply ice to the affected area 4 times daily for 20 minutes each application.  This can be achieved by using a bag of frozen peas or corn, a Ziploc bag filled with ice and water, or Ziploc bag filled with half rubbing alcohol and half Dawn dish detergent, frozen into a slush.  Please be careful not to apply ice directly to your skin, always place a soft cloth between you and the ice pack.   You are welcome to use topical anti-inflammatory creams such  as Voltaren gel, capsaicin or Aspercreme as recommended.  These medications are available over-the-counter, please follow manufactures instructions for use.  As a courtesy, I provided you with a prescription for diclofenac in the event that your insurance will pay for this.   Please consider discussing referral to physical therapy with your primary care provider.  Physical therapist are very good at teasing out the underlying cause of acute lower back pain and helping with prevention of future recurrences.   Please avoid attempts to stretch or strengthen the affected area until you are feeling completely pain-free.  Attempts to do so will only prolong the healing process.   Thank you for visiting urgent care today.  We appreciate the opportunity to participate in your care.        This office note has been dictated using Museum/gallery curator.  Unfortunately, and despite my best efforts, this method of dictation can sometimes lead to occasional typographical or grammatical errors.  I apologize in advance if this occurs.     Lynden Oxford Scales, PA-C 06/18/22 1319

## 2022-06-18 NOTE — ED Triage Notes (Addendum)
Pt c/o body aches, chills and sore throat. The patient also c/ lower back pain that has been on/off for 3 years.  Home interventions: Nyquil  Started: 3 days   The patient states her son has hand-foot- mouth disease.

## 2022-06-24 ENCOUNTER — Ambulatory Visit (HOSPITAL_BASED_OUTPATIENT_CLINIC_OR_DEPARTMENT_OTHER)
Admission: RE | Admit: 2022-06-24 | Discharge: 2022-06-24 | Disposition: A | Discharge status: Home / Self Care | Payer: PRIVATE HEALTH INSURANCE | Source: Ambulatory Visit | Attending: Medical | Admitting: Medical

## 2022-06-24 ENCOUNTER — Other Ambulatory Visit (HOSPITAL_BASED_OUTPATIENT_CLINIC_OR_DEPARTMENT_OTHER): Payer: Self-pay

## 2022-06-24 ENCOUNTER — Ambulatory Visit (INDEPENDENT_AMBULATORY_CARE_PROVIDER_SITE_OTHER): Payer: PRIVATE HEALTH INSURANCE | Admitting: Medical

## 2022-06-24 VITALS — BP 117/46 | HR 75 | Temp 97.9°F | Resp 18 | Ht 61.0 in | Wt 151.0 lb

## 2022-06-24 DIAGNOSIS — R059 Cough, unspecified: Secondary | ICD-10-CM | POA: Diagnosis not present

## 2022-06-24 DIAGNOSIS — M5431 Sciatica, right side: Secondary | ICD-10-CM

## 2022-06-24 DIAGNOSIS — R319 Hematuria, unspecified: Secondary | ICD-10-CM

## 2022-06-24 DIAGNOSIS — M5440 Lumbago with sciatica, unspecified side: Secondary | ICD-10-CM | POA: Diagnosis present

## 2022-06-24 DIAGNOSIS — J3489 Other specified disorders of nose and nasal sinuses: Secondary | ICD-10-CM | POA: Diagnosis not present

## 2022-06-24 DIAGNOSIS — R350 Frequency of micturition: Secondary | ICD-10-CM

## 2022-06-24 DIAGNOSIS — J0100 Acute maxillary sinusitis, unspecified: Secondary | ICD-10-CM

## 2022-06-24 LAB — POCT URINALYSIS DIPSTICK
Bilirubin, UA: NEGATIVE
Blood, UA: NEGATIVE
Glucose, UA: NEGATIVE
Ketones, UA: NEGATIVE
Leukocytes, UA: NEGATIVE
Nitrite, UA: NEGATIVE
Protein, UA: NEGATIVE
Spec Grav, UA: <=1.005 — AB (ref 1.010–1.025)
Urobilinogen, UA: 0.2 E.U./dL
pH, UA: 6 (ref 5.0–8.0)

## 2022-06-24 MED ORDER — BENZONATATE 100 MG PO CAPS
100.0000 mg | ORAL_CAPSULE | Freq: Three times a day (TID) | ORAL | 0 refills | Status: AC | PRN
  Filled 2022-06-24: qty 30, 10d supply, fill #0

## 2022-06-24 MED ORDER — DICLOFENAC SODIUM 75 MG PO TBEC
75.0000 mg | DELAYED_RELEASE_TABLET | Freq: Two times a day (BID) | ORAL | 0 refills | Status: AC
  Filled 2022-06-24: qty 20, 10d supply, fill #0

## 2022-06-24 MED ORDER — AMOXICILLIN-POT CLAVULANATE 875-125 MG PO TABS
1.0000 | ORAL_TABLET | Freq: Two times a day (BID) | ORAL | 0 refills | Status: DC
  Filled 2022-06-24: qty 20, 10d supply, fill #0

## 2022-06-24 NOTE — Progress Notes (Signed)
Subjective:    Patient ID: [name], female    DOB: 03/12/1971, 51 y.o.   MRN: [id number]  HPI  Pt in for some recent sinus symptoms for 2 weeks. She went to UC last week and given flonase and zyrtec. They did not give antibiotic but now coughing up mucus. She get sinus infection twice a year. She states usually needs antibiotics.  Last week had chills and fever with bodyaches. She tested for covid at home and was negative.   Also year of on and off rt side lower back pain. Pain usually on movement and rolling over in bed. When she was in urgent care she mentioned and she thinks told may have blood in urine. Though not sure if had blood. She thinks pain in rt lower back/si area decreases at times when urinates.    Pt thinks has some frequency of urination a times.   Review of Systems  Constitutional:  Negative for chills, fatigue and fever.  HENT:  Positive for congestion and sinus pressure.   Respiratory:  Positive for cough. Negative for chest tightness, shortness of breath and wheezing.   Cardiovascular:  Negative for chest pain and palpitations.  Gastrointestinal:  Negative for abdominal pain, diarrhea and nausea.  Genitourinary:  Negative for difficulty urinating, dysuria and frequency.  Musculoskeletal:  Negative for back pain.  Skin:  Negative for rash.  Neurological:  Negative for dizziness, numbness and headaches.  Hematological:  Negative for adenopathy. Does not bruise/bleed easily.  Psychiatric/Behavioral:  Negative for behavioral problems and confusion. The patient is not nervous/anxious.     Past Medical History:  Diagnosis Date   Allergy    Anemia    hx   Chronic female pelvic pain    improved after hysterectomy   Cyst of left kidney    rt and lft   Dry eye syndrome    and eczema   GERD (gastroesophageal reflux disease)    History of kidney stones    History of nephrolithiasis    History of syncope    during pregnancy     Social History    Socioeconomic History   Marital status: Married    Spouse name: Not on file   Number of children: Not on file   Years of education: Not on file   Highest education level: Not on file  Occupational History   Not on file  Tobacco Use   Smoking status: Every Day    Packs/day: 0.50    Years: 10.00    Total pack years: 5.00    Types: Cigarettes   Smokeless tobacco: Never   Tobacco comments:    5 cigarettes daily  Substance and Sexual Activity   Alcohol use: Yes    Alcohol/week: 0.0 standard drinks of alcohol    Comment: rarely   Drug use: No   Sexual activity: Not on file  Other Topics Concern   Not on file  Social History Narrative   Education officer, environmental for [employer]   Married 6 years   Children-teenagers   Social Determinants of Health   Financial Resource Strain: Not on file  Food Insecurity: Not on file  Transportation Needs: Not on file  Physical Activity: Not on file  Stress: Not on file  Social Connections: Not on file  Intimate Partner Violence: Not on file    Past Surgical History:  Procedure Laterality Date   APPENDECTOMY     BREAST CYST ASPIRATION Right 11/12/2021   CHOLECYSTECTOMY N/A 05/26/2017  Procedure: LAPAROSCOPIC CHOLECYSTECTOMY;  Surgeon: Coralie Keens, MD;  Location: [place];  Service: General;  Laterality: N/A;   COLONOSCOPY  2003   Dr. Isa Rankin   TONSILLECTOMY     UPPER GASTROINTESTINAL ENDOSCOPY     VAGINAL HYSTERECTOMY      Family History  Problem Relation Age of Onset   Uterine cancer Mother    Colon polyps Father    Diabetes type II Paternal Grandfather    Coronary artery disease Paternal Grandfather    Hyperlipidemia Paternal Grandfather    Hypertension Other        maternal & Paternal grandfather   Stomach cancer Other    Breast cancer Neg Hx    Colon cancer Neg Hx    Rectal cancer Neg Hx    Pancreatic cancer Neg Hx     Allergies  Allergen Reactions   Morphine And Related     Nausea and vomiting    Current  Outpatient Medications on File Prior to Visit  Medication Sig Dispense Refill   baclofen (LIORESAL) 10 MG tablet Take 1 tablet (10 mg total) by mouth at bedtime for 7 days. 7 tablet 0   cetirizine (ZYRTEC ALLERGY) 10 MG tablet Take 1 tablet (10 mg total) by mouth at bedtime. 90 tablet 1   fluticasone (FLONASE) 50 MCG/ACT nasal spray Place 1 spray into both nostrils daily. 48 g 1   ibuprofen (ADVIL) 400 MG tablet Take 1 tablet (400 mg total) by mouth every 8 (eight) hours as needed for up to 30 doses. 30 tablet 0   diclofenac Sodium (VOLTAREN) 1 % GEL Apply 4 g topically 4 (four) times daily. Apply to affected areas 4 times daily as needed for pain. 100 g 2   montelukast (SINGULAIR) 10 MG tablet Take 1 tablet (10 mg total) by mouth at bedtime. 30 tablet 0   valACYclovir (VALTREX) 1000 MG tablet Take 1,000 mg by mouth 2 (two) times daily.     No current facility-administered medications on file prior to visit.    BP (!) 117/46   Pulse 75   Temp 97.9 F (36.6 C)   Resp 18   Ht '5\' 1"'$  (1.549 m)   Wt 151 lb (68.5 kg)   SpO2 97%   BMI 28.53 kg/m        Objective:   Physical Exam   General Mental Status- Alert. General Appearance- Not in acute distress.   Skin General: Color- Normal Color. Moisture- Normal Moisture.   Chest and Lung Exam Auscultation: Breath Sounds:-Normal.  Cardiovascular Auscultation:Rythm- Regular. Murmurs & Other Heart Sounds:Auscultation of the heart reveals- No Murmurs.   Neurologic Cranial Nerve exam:- CN III-XII intact(No nystagmus), symmetric smile. Strength:- 5/5 equal and symmetric strength both upper and lower extremities.   Heent- nasal congestion and maxillar sinus pressure.canals clear and normal tm.      Assessment & Plan:   Patient Instructions  Sinus infection- continue flonase and adding on augmentin.  For cough rx benzonatate. If cough persists consider chest xray. Also if cough severe can rx hycodan.  For back pain/sciatica  get lumbar spine xray. Rx diclofenac nsaid. Can use baclofen at night.  For recent blood in urine and frequent urination get urine culture. Today urine shows no blood.  Presently doubt kidney stone but if features change let me know.  Follow 2 weeks or sooner if needed.     Mackie Pai, PA-C

## 2022-06-24 NOTE — Patient Instructions (Addendum)
Sinus infection- continue flonase and adding on augmentin.  For cough rx benzonatate. If cough persists consider chest xray. Also if cough severe can rx hycodan.  For back pain/sciatica get lumbar spine xray. Rx diclofenac nsaid. Can use baclofen at night.  For recent blood in urine and frequent urination get urine culture. Today urine shows no blood.  Presently doubt kidney stone but if features change let me know.  Follow 2 weeks or sooner if needed.

## 2022-12-22 ENCOUNTER — Telehealth: Payer: PRIVATE HEALTH INSURANCE | Admitting: Family

## 2022-12-22 DIAGNOSIS — J019 Acute sinusitis, unspecified: Secondary | ICD-10-CM | POA: Diagnosis not present

## 2022-12-22 MED ORDER — AMOXICILLIN-POT CLAVULANATE 875-125 MG PO TABS: 1.0000 | ORAL_TABLET | Freq: Two times a day (BID) | ORAL | 0 refills | Status: DC

## 2022-12-22 NOTE — Progress Notes (Signed)

## 2023-01-07 ENCOUNTER — Telehealth: Payer: Self-pay | Admitting: Physician Assistant

## 2023-01-07 DIAGNOSIS — B001 Herpesviral vesicular dermatitis: Secondary | ICD-10-CM

## 2023-01-07 MED ORDER — VALACYCLOVIR HCL 1 G PO TABS: 2000.0000 mg | ORAL_TABLET | Freq: Two times a day (BID) | ORAL | 0 refills | Status: AC

## 2023-01-07 NOTE — Progress Notes (Signed)
I have spent 5 minutes in review of e-visit questionnaire, review and updating patient chart, medical decision making and response to patient.   Elton Heid Cody Coren Crownover, PA-C    

## 2023-01-07 NOTE — Progress Notes (Signed)

## 2023-03-14 ENCOUNTER — Telehealth: Payer: Self-pay | Admitting: Physician Assistant

## 2023-03-14 DIAGNOSIS — J019 Acute sinusitis, unspecified: Secondary | ICD-10-CM

## 2023-03-14 MED ORDER — AMOXICILLIN-POT CLAVULANATE 875-125 MG PO TABS: 1.0000 | ORAL_TABLET | Freq: Two times a day (BID) | ORAL | 0 refills | Status: AC

## 2023-03-14 NOTE — Progress Notes (Signed)

## 2023-04-12 ENCOUNTER — Encounter: Payer: Self-pay | Admitting: *Deleted
